# Patient Record
Sex: Male | Born: 1950
Health system: Southern US, Community
[De-identification: ages and names within clinical notes are randomized; demographics above are authoritative.]

## PROBLEM LIST (undated history)

## (undated) DIAGNOSIS — M1990 Unspecified osteoarthritis, unspecified site: Secondary | ICD-10-CM

## (undated) DIAGNOSIS — E119 Type 2 diabetes mellitus without complications: Secondary | ICD-10-CM

## (undated) DIAGNOSIS — C61 Malignant neoplasm of prostate: Secondary | ICD-10-CM

## (undated) DIAGNOSIS — I10 Essential (primary) hypertension: Secondary | ICD-10-CM

---

## 1999-12-16 ENCOUNTER — Ambulatory Visit (HOSPITAL_COMMUNITY): Admission: RE | Admit: 1999-12-16 | Discharge: 1999-12-16 | Payer: Self-pay | Admitting: Neurology

## 1999-12-16 ENCOUNTER — Encounter: Payer: Self-pay | Admitting: Internal Medicine

## 2000-05-16 ENCOUNTER — Ambulatory Visit (HOSPITAL_COMMUNITY): Admission: RE | Admit: 2000-05-16 | Discharge: 2000-05-16 | Payer: Self-pay | Admitting: Internal Medicine

## 2004-04-25 ENCOUNTER — Encounter: Admission: RE | Admit: 2004-04-25 | Discharge: 2004-04-25 | Payer: Self-pay | Admitting: Emergency Medicine

## 2005-05-12 ENCOUNTER — Encounter: Admission: RE | Admit: 2005-05-12 | Discharge: 2005-05-12 | Payer: Self-pay | Admitting: Family Medicine

## 2005-07-24 HISTORY — PX: JOINT REPLACEMENT: SHX530

## 2005-08-29 ENCOUNTER — Inpatient Hospital Stay (HOSPITAL_COMMUNITY): Admission: RE | Admit: 2005-08-29 | Discharge: 2005-09-02 | Payer: Self-pay | Admitting: Orthopedic Surgery

## 2005-08-29 ENCOUNTER — Ambulatory Visit: Payer: Self-pay | Admitting: Specialist

## 2006-09-07 ENCOUNTER — Encounter: Admission: RE | Admit: 2006-09-07 | Discharge: 2006-09-07 | Payer: Self-pay | Admitting: Nephrology

## 2013-05-29 ENCOUNTER — Other Ambulatory Visit: Payer: Self-pay | Admitting: Urology

## 2013-07-25 ENCOUNTER — Encounter (HOSPITAL_COMMUNITY): Payer: Self-pay | Admitting: Pharmacy Technician

## 2013-07-31 ENCOUNTER — Encounter (HOSPITAL_COMMUNITY): Payer: Self-pay

## 2013-07-31 ENCOUNTER — Encounter (HOSPITAL_COMMUNITY)
Admission: RE | Admit: 2013-07-31 | Discharge: 2013-07-31 | Disposition: A | Discharge status: Home / Self Care | Payer: Medicare Other | Source: Ambulatory Visit | Attending: Urology | Admitting: Urology

## 2013-07-31 DIAGNOSIS — C61 Malignant neoplasm of prostate: Secondary | ICD-10-CM | POA: Insufficient documentation

## 2013-07-31 DIAGNOSIS — Z01812 Encounter for preprocedural laboratory examination: Secondary | ICD-10-CM | POA: Insufficient documentation

## 2013-07-31 HISTORY — DX: Unspecified osteoarthritis, unspecified site: M19.90

## 2013-07-31 HISTORY — DX: Type 2 diabetes mellitus without complications: E11.9

## 2013-07-31 HISTORY — DX: Malignant neoplasm of prostate: C61

## 2013-07-31 HISTORY — DX: Essential (primary) hypertension: I10

## 2013-07-31 LAB — BASIC METABOLIC PANEL
BUN: 11 mg/dL (ref 6–23)
CO2: 28 meq/L (ref 19–32)
CREATININE: 1.16 mg/dL (ref 0.50–1.35)
Calcium: 9.5 mg/dL (ref 8.4–10.5)
Chloride: 96 mEq/L (ref 96–112)
GFR calc Af Amer: 75 mL/min — ABNORMAL LOW (ref >90)
GFR, EST NON AFRICAN AMERICAN: 65 mL/min — AB (ref >90)
Glucose, Bld: 209 mg/dL — ABNORMAL HIGH (ref 70–99)
Potassium: 3.4 mEq/L — ABNORMAL LOW (ref 3.7–5.3)
Sodium: 136 mEq/L — ABNORMAL LOW (ref 137–147)

## 2013-07-31 LAB — CBC
HCT: 44 % (ref 39.0–52.0)
HEMOGLOBIN: 16.3 g/dL (ref 13.0–17.0)
MCH: 32.5 pg (ref 26.0–34.0)
MCHC: 37 g/dL — ABNORMAL HIGH (ref 30.0–36.0)
MCV: 87.6 fL (ref 78.0–100.0)
Platelets: 202 10*3/uL (ref 150–400)
RBC: 5.02 MIL/uL (ref 4.22–5.81)
RDW: 13.1 % (ref 11.5–15.5)
WBC: 4.7 10*3/uL (ref 4.0–10.5)

## 2013-07-31 NOTE — Progress Notes (Signed)
07/31/13 0820  OBSTRUCTIVE SLEEP APNEA  Have you ever been diagnosed with sleep apnea through a sleep study? No  Do you snore loudly (loud enough to be heard through closed doors)?  1  Do you often feel tired, fatigued, or sleepy during the daytime? 0  Has anyone observed you stop breathing during your sleep? 0  Do you have, or are you being treated for high blood pressure? 1  BMI more than 35 kg/m2? 0  Age over 65 years old? 1  Neck circumference greater than 40 cm/18 inches? 0  Gender: 1  Obstructive Sleep Apnea Score 4  Score 4 or greater  Results sent to PCP

## 2013-07-31 NOTE — Patient Instructions (Signed)
Arthur Carr  07/31/2013                           YOUR PROCEDURE IS SCHEDULED ON: 08/06/13               PLEASE REPORT TO SHORT STAY CENTER AT : 10:15 AM               CALL THIS NUMBER IF ANY PROBLEMS THE DAY OF SURGERY :               [phone]                      REMEMBER:   Do not eat food or drink liquids AFTER MIDNIGHT  May have clear liquids UNTIL 6 HOURS BEFORE SURGERY (6:45 AM)  Clear liquids include soda, tea, black coffee, apple or grape juice, broth.  Take these medicines the morning of surgery with A SIP OF WATER:  AMLODIPINE   Do not wear jewelry, make-up   Do not wear lotions, powders, or perfumes.   Do not shave legs or underarms 12 hrs. before surgery (men may shave face)  Do not bring valuables to the hospital.  Contacts, dentures or bridgework may not be worn into surgery.  Leave suitcase in the car. After surgery it may be brought to your room.  For patients admitted to the hospital more than one night, checkout time is 11:00                          The day of discharge.   Patients discharged the day of surgery will not be allowed to drive home                             If going home same day of surgery, must have someone stay with you first                           24 hrs at home and arrange for some one to drive you home from hospital.    Special Instructions:   Please read over the following fact sheets that you were given:                       1. [place]                                                X_____________________________________________________________________        Failure to follow these instructions may result in cancellation of your surgery

## 2013-08-06 ENCOUNTER — Encounter (HOSPITAL_COMMUNITY): Payer: Self-pay | Admitting: *Deleted

## 2013-08-06 ENCOUNTER — Encounter (HOSPITAL_COMMUNITY): Payer: Medicare Other | Admitting: Anesthesiology

## 2013-08-06 ENCOUNTER — Encounter (HOSPITAL_COMMUNITY)
Admission: RE | Disposition: A | Discharge status: Home / Self Care | Payer: Self-pay | Source: Ambulatory Visit | Attending: Urology

## 2013-08-06 ENCOUNTER — Inpatient Hospital Stay (HOSPITAL_COMMUNITY)
Admission: RE | Admit: 2013-08-06 | Discharge: 2013-08-08 | DRG: 708 | Disposition: A | Discharge status: Home / Self Care | Payer: Medicare Other | Source: Ambulatory Visit | Attending: Urology | Admitting: Urology

## 2013-08-06 ENCOUNTER — Inpatient Hospital Stay (HOSPITAL_COMMUNITY): Payer: Medicare Other | Admitting: Anesthesiology

## 2013-08-06 DIAGNOSIS — F17200 Nicotine dependence, unspecified, uncomplicated: Secondary | ICD-10-CM | POA: Diagnosis present

## 2013-08-06 DIAGNOSIS — E669 Obesity, unspecified: Secondary | ICD-10-CM | POA: Diagnosis present

## 2013-08-06 DIAGNOSIS — Z01812 Encounter for preprocedural laboratory examination: Secondary | ICD-10-CM

## 2013-08-06 DIAGNOSIS — I10 Essential (primary) hypertension: Secondary | ICD-10-CM | POA: Diagnosis present

## 2013-08-06 DIAGNOSIS — Z6832 Body mass index (BMI) 32.0-32.9, adult: Secondary | ICD-10-CM

## 2013-08-06 DIAGNOSIS — E119 Type 2 diabetes mellitus without complications: Secondary | ICD-10-CM | POA: Diagnosis present

## 2013-08-06 DIAGNOSIS — E291 Testicular hypofunction: Secondary | ICD-10-CM | POA: Diagnosis present

## 2013-08-06 DIAGNOSIS — C61 Malignant neoplasm of prostate: Principal | ICD-10-CM | POA: Diagnosis present

## 2013-08-06 DIAGNOSIS — K429 Umbilical hernia without obstruction or gangrene: Secondary | ICD-10-CM | POA: Diagnosis present

## 2013-08-06 DIAGNOSIS — Z96649 Presence of unspecified artificial hip joint: Secondary | ICD-10-CM

## 2013-08-06 HISTORY — PX: ROBOT ASSISTED LAPAROSCOPIC RADICAL PROSTATECTOMY: SHX5141

## 2013-08-06 HISTORY — PX: LYMPHADENECTOMY: SHX5960

## 2013-08-06 LAB — GLUCOSE, CAPILLARY
GLUCOSE-CAPILLARY: 177 mg/dL — AB (ref 70–99)
GLUCOSE-CAPILLARY: 197 mg/dL — AB (ref 70–99)
Glucose-Capillary: 180 mg/dL — ABNORMAL HIGH (ref 70–99)

## 2013-08-06 LAB — TYPE AND SCREEN
ABO/RH(D): A POS
ANTIBODY SCREEN: NEGATIVE

## 2013-08-06 LAB — ABO/RH: ABO/RH(D): A POS

## 2013-08-06 SURGERY — ROBOTIC ASSISTED LAPAROSCOPIC RADICAL PROSTATECTOMY
Anesthesia: General | Site: Pelvis

## 2013-08-06 MED ORDER — SENNA 8.6 MG PO TABS
1.0000 | ORAL_TABLET | Freq: Two times a day (BID) | ORAL | Status: DC
  Administered 2013-08-06 – 2013-08-08 (×4): 8.6 mg via ORAL
  Filled 2013-08-06 (×4): qty 1

## 2013-08-06 MED ORDER — FUROSEMIDE 80 MG PO TABS
80.0000 mg | ORAL_TABLET | Freq: Two times a day (BID) | ORAL | Status: DC
  Administered 2013-08-07 – 2013-08-08 (×3): 80 mg via ORAL
  Filled 2013-08-06 (×5): qty 1

## 2013-08-06 MED ORDER — LABETALOL HCL 5 MG/ML IV SOLN
INTRAVENOUS | Status: AC
  Filled 2013-08-06: qty 4

## 2013-08-06 MED ORDER — FENTANYL CITRATE 0.05 MG/ML IJ SOLN
INTRAMUSCULAR | Status: AC
  Filled 2013-08-06: qty 5

## 2013-08-06 MED ORDER — ONDANSETRON HCL 4 MG/2ML IJ SOLN
INTRAMUSCULAR | Status: DC | PRN
  Administered 2013-08-06: 4 mg via INTRAVENOUS

## 2013-08-06 MED ORDER — SIMVASTATIN 10 MG PO TABS
10.0000 mg | ORAL_TABLET | Freq: Every day | ORAL | Status: DC
  Administered 2013-08-07: 10 mg via ORAL
  Filled 2013-08-06 (×2): qty 1

## 2013-08-06 MED ORDER — LACTATED RINGERS IV SOLN
INTRAVENOUS | Status: DC | PRN
  Administered 2013-08-06: 12:00:00 via INTRAVENOUS

## 2013-08-06 MED ORDER — SODIUM CHLORIDE 0.9 % IJ SOLN
INTRAMUSCULAR | Status: AC
  Filled 2013-08-06: qty 20

## 2013-08-06 MED ORDER — HYDROMORPHONE HCL PF 1 MG/ML IJ SOLN
INTRAMUSCULAR | Status: AC
  Filled 2013-08-06: qty 1

## 2013-08-06 MED ORDER — CEFAZOLIN SODIUM-DEXTROSE 2-3 GM-% IV SOLR
2.0000 g | INTRAVENOUS | Status: AC
  Administered 2013-08-06: 2 g via INTRAVENOUS

## 2013-08-06 MED ORDER — HEPARIN SODIUM (PORCINE) 5000 UNIT/ML IJ SOLN
5000.0000 [IU] | Freq: Three times a day (TID) | INTRAMUSCULAR | Status: DC
  Administered 2013-08-06 – 2013-08-08 (×5): 5000 [IU] via SUBCUTANEOUS
  Filled 2013-08-06 (×8): qty 1

## 2013-08-06 MED ORDER — OXYBUTYNIN CHLORIDE 5 MG PO TABS
5.0000 mg | ORAL_TABLET | Freq: Three times a day (TID) | ORAL | Status: DC | PRN
  Filled 2013-08-06: qty 1

## 2013-08-06 MED ORDER — AMLODIPINE BESYLATE 5 MG PO TABS
5.0000 mg | ORAL_TABLET | Freq: Every day | ORAL | Status: DC
  Administered 2013-08-07 – 2013-08-08 (×2): 5 mg via ORAL
  Filled 2013-08-06 (×2): qty 1

## 2013-08-06 MED ORDER — ACETAMINOPHEN 500 MG PO TABS
1000.0000 mg | ORAL_TABLET | Freq: Four times a day (QID) | ORAL | Status: AC
  Administered 2013-08-06 – 2013-08-07 (×4): 1000 mg via ORAL
  Filled 2013-08-06 (×4): qty 2

## 2013-08-06 MED ORDER — HYDROCODONE-ACETAMINOPHEN 5-325 MG PO TABS: 1.0000 | ORAL_TABLET | Freq: Four times a day (QID) | ORAL | Status: DC | PRN

## 2013-08-06 MED ORDER — CIPROFLOXACIN HCL 500 MG PO TABS: 500.0000 mg | ORAL_TABLET | Freq: Two times a day (BID) | ORAL | Status: DC

## 2013-08-06 MED ORDER — SODIUM CHLORIDE 0.9 % IV SOLN
INTRAVENOUS | Status: DC
  Administered 2013-08-07 (×2): via INTRAVENOUS

## 2013-08-06 MED ORDER — CEFAZOLIN SODIUM-DEXTROSE 2-3 GM-% IV SOLR
INTRAVENOUS | Status: AC
  Filled 2013-08-06: qty 50

## 2013-08-06 MED ORDER — BUPIVACAINE LIPOSOME 1.3 % IJ SUSP
20.0000 mL | Freq: Once | INTRAMUSCULAR | Status: DC
  Filled 2013-08-06: qty 20

## 2013-08-06 MED ORDER — ROCURONIUM BROMIDE 100 MG/10ML IV SOLN
INTRAVENOUS | Status: AC
  Filled 2013-08-06: qty 1

## 2013-08-06 MED ORDER — LABETALOL HCL 5 MG/ML IV SOLN
INTRAVENOUS | Status: DC | PRN
  Administered 2013-08-06 (×2): 5 mg via INTRAVENOUS

## 2013-08-06 MED ORDER — SODIUM CHLORIDE 0.9 % IR SOLN
Status: DC | PRN
  Administered 2013-08-06: 1000 mL via INTRAVESICAL

## 2013-08-06 MED ORDER — LIDOCAINE HCL (CARDIAC) 20 MG/ML IV SOLN
INTRAVENOUS | Status: DC | PRN
  Administered 2013-08-06: 100 mg via INTRAVENOUS

## 2013-08-06 MED ORDER — MIDAZOLAM HCL 5 MG/5ML IJ SOLN
INTRAMUSCULAR | Status: DC | PRN
  Administered 2013-08-06: 2 mg via INTRAVENOUS

## 2013-08-06 MED ORDER — OXYCODONE HCL 5 MG PO TABS
5.0000 mg | ORAL_TABLET | ORAL | Status: DC | PRN
  Administered 2013-08-07: 5 mg via ORAL
  Filled 2013-08-06: qty 1

## 2013-08-06 MED ORDER — BENAZEPRIL HCL 40 MG PO TABS
40.0000 mg | ORAL_TABLET | Freq: Every day | ORAL | Status: DC
  Administered 2013-08-07 – 2013-08-08 (×2): 40 mg via ORAL
  Filled 2013-08-06 (×3): qty 1

## 2013-08-06 MED ORDER — PROPOFOL 10 MG/ML IV BOLUS
INTRAVENOUS | Status: AC
  Filled 2013-08-06: qty 20

## 2013-08-06 MED ORDER — SUCCINYLCHOLINE CHLORIDE 20 MG/ML IJ SOLN
INTRAMUSCULAR | Status: DC | PRN
  Administered 2013-08-06: 100 mg via INTRAVENOUS

## 2013-08-06 MED ORDER — STERILE WATER FOR IRRIGATION IR SOLN
Status: DC | PRN
  Administered 2013-08-06: 3000 mL

## 2013-08-06 MED ORDER — LACTATED RINGERS IR SOLN
Status: DC | PRN
  Administered 2013-08-06: 1000 mL

## 2013-08-06 MED ORDER — ONDANSETRON HCL 4 MG/2ML IJ SOLN
INTRAMUSCULAR | Status: AC
  Filled 2013-08-06: qty 2

## 2013-08-06 MED ORDER — GLYCOPYRROLATE 0.2 MG/ML IJ SOLN
INTRAMUSCULAR | Status: DC | PRN
  Administered 2013-08-06: .8 mg via INTRAVENOUS

## 2013-08-06 MED ORDER — PROMETHAZINE HCL 25 MG/ML IJ SOLN: 6.2500 mg | INTRAMUSCULAR | Status: DC | PRN

## 2013-08-06 MED ORDER — SODIUM CHLORIDE 0.9 % IV BOLUS (SEPSIS)
1000.0000 mL | Freq: Once | INTRAVENOUS | Status: AC
  Administered 2013-08-06: 1000 mL via INTRAVENOUS

## 2013-08-06 MED ORDER — HYDROMORPHONE HCL PF 1 MG/ML IJ SOLN
0.2500 mg | INTRAMUSCULAR | Status: DC | PRN
  Administered 2013-08-06 (×4): 0.5 mg via INTRAVENOUS

## 2013-08-06 MED ORDER — SODIUM CHLORIDE 0.9 % IJ SOLN
INTRAMUSCULAR | Status: DC | PRN
  Administered 2013-08-06: 17:00:00

## 2013-08-06 MED ORDER — INSULIN ASPART 100 UNIT/ML ~~LOC~~ SOLN
0.0000 [IU] | Freq: Three times a day (TID) | SUBCUTANEOUS | Status: DC
  Administered 2013-08-07 – 2013-08-08 (×4): 3 [IU] via SUBCUTANEOUS

## 2013-08-06 MED ORDER — FENTANYL CITRATE 0.05 MG/ML IJ SOLN
INTRAMUSCULAR | Status: DC | PRN
  Administered 2013-08-06: 50 ug via INTRAVENOUS
  Administered 2013-08-06 (×2): 100 ug via INTRAVENOUS
  Administered 2013-08-06: 50 ug via INTRAVENOUS
  Administered 2013-08-06: 100 ug via INTRAVENOUS
  Administered 2013-08-06 (×2): 50 ug via INTRAVENOUS

## 2013-08-06 MED ORDER — NEOSTIGMINE METHYLSULFATE 1 MG/ML IJ SOLN
INTRAMUSCULAR | Status: DC | PRN
  Administered 2013-08-06: 5 mg via INTRAVENOUS

## 2013-08-06 MED ORDER — ROCURONIUM BROMIDE 100 MG/10ML IV SOLN
INTRAVENOUS | Status: DC | PRN
  Administered 2013-08-06: 50 mg via INTRAVENOUS
  Administered 2013-08-06: 20 mg via INTRAVENOUS

## 2013-08-06 MED ORDER — GLYCOPYRROLATE 0.2 MG/ML IJ SOLN
INTRAMUSCULAR | Status: AC
  Filled 2013-08-06: qty 4

## 2013-08-06 MED ORDER — INSULIN ASPART 100 UNIT/ML ~~LOC~~ SOLN
4.0000 [IU] | Freq: Three times a day (TID) | SUBCUTANEOUS | Status: DC
Start: 2013-08-07 — End: 2013-08-08
  Administered 2013-08-07 (×3): 4 [IU] via SUBCUTANEOUS

## 2013-08-06 MED ORDER — INDOCYANINE GREEN 25 MG IV SOLR
INTRAVENOUS | Status: DC | PRN
  Administered 2013-08-06: 1 mg

## 2013-08-06 MED ORDER — PROPOFOL 10 MG/ML IV BOLUS
INTRAVENOUS | Status: DC | PRN
  Administered 2013-08-06: 200 mg via INTRAVENOUS

## 2013-08-06 MED ORDER — LIDOCAINE HCL (CARDIAC) 20 MG/ML IV SOLN
INTRAVENOUS | Status: AC
  Filled 2013-08-06: qty 5

## 2013-08-06 MED ORDER — DOCUSATE SODIUM 100 MG PO CAPS
100.0000 mg | ORAL_CAPSULE | Freq: Two times a day (BID) | ORAL | Status: DC
  Administered 2013-08-06 – 2013-08-08 (×4): 100 mg via ORAL
  Filled 2013-08-06 (×5): qty 1

## 2013-08-06 MED ORDER — HYDROMORPHONE HCL PF 1 MG/ML IJ SOLN: 0.5000 mg | INTRAMUSCULAR | Status: DC | PRN

## 2013-08-06 MED ORDER — MIDAZOLAM HCL 2 MG/2ML IJ SOLN
INTRAMUSCULAR | Status: AC
  Filled 2013-08-06: qty 2

## 2013-08-06 SURGICAL SUPPLY — 62 items
ADH SKN CLS APL DERMABOND .7 (GAUZE/BANDAGES/DRESSINGS) ×2
CABLE HIGH FREQUENCY MONO STRZ (ELECTRODE) ×3 IMPLANT
CANISTER SUCTION 2500CC (MISCELLANEOUS) ×3 IMPLANT
CATH FOLEY 2WAY SLVR 18FR 30CC (CATHETERS) ×3 IMPLANT
CATH TIEMANN FOLEY 18FR 5CC (CATHETERS) ×3 IMPLANT
CHLORAPREP W/TINT 26ML (MISCELLANEOUS) ×3 IMPLANT
CLIP LIGATING HEM O LOK PURPLE (MISCELLANEOUS) ×8 IMPLANT
CLIP LIGATING HEMO LOK XL GOLD (MISCELLANEOUS) ×4 IMPLANT
CLOTH BEACON ORANGE TIMEOUT ST (SAFETY) ×3 IMPLANT
CONT SPECI 4OZ STER CLIK (MISCELLANEOUS) ×3 IMPLANT
COVER SURGICAL LIGHT HANDLE (MISCELLANEOUS) ×3 IMPLANT
COVER TIP SHEARS 8 DVNC (MISCELLANEOUS) ×2 IMPLANT
COVER TIP SHEARS 8MM DA VINCI (MISCELLANEOUS) ×1
CUTTER ECHEON FLEX ENDO 45 340 (ENDOMECHANICALS) ×3 IMPLANT
DECANTER SPIKE VIAL GLASS SM (MISCELLANEOUS) ×3 IMPLANT
DERMABOND ADVANCED (GAUZE/BANDAGES/DRESSINGS) ×1
DERMABOND ADVANCED .7 DNX12 (GAUZE/BANDAGES/DRESSINGS) ×2 IMPLANT
DRAPE SURG IRRIG POUCH 19X23 (DRAPES) ×3 IMPLANT
DRSG TEGADERM 2-3/8X2-3/4 SM (GAUZE/BANDAGES/DRESSINGS) ×12 IMPLANT
DRSG TEGADERM 4X4.75 (GAUZE/BANDAGES/DRESSINGS) ×6 IMPLANT
DRSG TEGADERM 6X8 (GAUZE/BANDAGES/DRESSINGS) ×6 IMPLANT
ELECT REM PT RETURN 9FT ADLT (ELECTROSURGICAL) ×3
ELECTRODE REM PT RTRN 9FT ADLT (ELECTROSURGICAL) ×2 IMPLANT
GAUZE SPONGE 2X2 8PLY STRL LF (GAUZE/BANDAGES/DRESSINGS) ×2 IMPLANT
GLOVE BIO SURGEON STRL SZ 6.5 (GLOVE) ×3 IMPLANT
GLOVE BIOGEL M STRL SZ7.5 (GLOVE) ×9 IMPLANT
GLOVE BIOGEL PI IND STRL 6.5 (GLOVE) IMPLANT
GLOVE BIOGEL PI IND STRL 7.0 (GLOVE) IMPLANT
GLOVE BIOGEL PI INDICATOR 6.5 (GLOVE) ×2
GLOVE BIOGEL PI INDICATOR 7.0 (GLOVE) ×2
GLOVE SS BIOGEL STRL SZ 7 (GLOVE) IMPLANT
GLOVE SUPERSENSE BIOGEL SZ 7 (GLOVE) ×3
GOWN STRL REUS W/TWL LRG LVL3 (GOWN DISPOSABLE) ×8 IMPLANT
GOWN STRL REUS W/TWL XL LVL3 (GOWN DISPOSABLE) ×6 IMPLANT
HOLDER FOLEY CATH W/STRAP (MISCELLANEOUS) ×3 IMPLANT
IV LACTATED RINGERS 1000ML (IV SOLUTION) ×3 IMPLANT
KIT ACCESSORY DA VINCI DISP (KITS) ×1
KIT ACCESSORY DVNC DISP (KITS) ×2 IMPLANT
KIT PROCEDURE DA VINCI SI (MISCELLANEOUS) ×1
KIT PROCEDURE DVNC SI (MISCELLANEOUS) ×2 IMPLANT
NDL INSUFFLATION 14GA 120MM (NEEDLE) ×2 IMPLANT
NEEDLE INSUFFLATION 14GA 120MM (NEEDLE) ×3 IMPLANT
NEEDLE SPNL 22GX7 SPINOC (NEEDLE) ×3 IMPLANT
PACK ROBOT UROLOGY CUSTOM (CUSTOM PROCEDURE TRAY) ×3 IMPLANT
RELOAD GREEN ECHELON 45 (STAPLE) ×3 IMPLANT
SET TUBE IRRIG SUCTION NO TIP (IRRIGATION / IRRIGATOR) ×3 IMPLANT
SOLUTION ELECTROLUBE (MISCELLANEOUS) ×3 IMPLANT
SPONGE GAUZE 2X2 STER 10/PKG (GAUZE/BANDAGES/DRESSINGS) ×1
SPONGE LAP 4X18 X RAY DECT (DISPOSABLE) ×3 IMPLANT
SUT ETHILON 3 0 PS 1 (SUTURE) ×3 IMPLANT
SUT MNCRL AB 4-0 PS2 18 (SUTURE) ×6 IMPLANT
SUT PDS AB 1 CT1 27 (SUTURE) ×6 IMPLANT
SUT PROLENE 1 CT 1 30 (SUTURE) ×2 IMPLANT
SUT VIC AB 2-0 SH 27 (SUTURE) ×6
SUT VIC AB 2-0 SH 27X BRD (SUTURE) IMPLANT
SUT VICRYL 0 UR6 27IN ABS (SUTURE) ×3 IMPLANT
SUT VLOC BARB 180 ABS3/0GR12 (SUTURE) ×9
SUTURE VLOC BRB 180 ABS3/0GR12 (SUTURE) IMPLANT
SYR 27GX1/2 1ML LL SAFETY (SYRINGE) ×3 IMPLANT
TOWEL OR NON WOVEN STRL DISP B (DISPOSABLE) ×3 IMPLANT
TROCAR 12M 150ML BLUNT (TROCAR) ×3 IMPLANT
WATER STERILE IRR 1500ML POUR (IV SOLUTION) ×6 IMPLANT

## 2013-08-06 NOTE — Anesthesia Postprocedure Evaluation (Signed)
  Anesthesia Post-op Note  Patient: Arthur Carr  Procedure(s) Performed: Procedure(s) (LRB): ROBOTIC ASSISTED LAPAROSCOPIC RADICAL PROSTATECTOMY/OPEN UMBILICAL HERNIA REPAIR (N/A) LYMPHADENECTOMY AND INDOCYANINE GREEN DYE  INJECTION (Bilateral)  Patient Location: PACU  Anesthesia Type: General  Level of Consciousness: awake and alert   Airway and Oxygen Therapy: Patient Spontanous Breathing  Post-op Pain: mild  Post-op Assessment: Post-op Vital signs reviewed, Patient's Cardiovascular Status Stable, Respiratory Function Stable, Patent Airway and No signs of Nausea or vomiting  Last Vitals:  Filed Vitals:   08/06/13 1907  BP: 169/75  Pulse: 85  Temp: 36.4 C  Resp:     Post-op Vital Signs: stable   Complications: No apparent anesthesia complications

## 2013-08-06 NOTE — Transfer of Care (Signed)
Immediate Anesthesia Transfer of Care Note  Patient: Devlon Dosher  Procedure(s) Performed: Procedure(s): ROBOTIC ASSISTED LAPAROSCOPIC RADICAL PROSTATECTOMY/OPEN UMBILICAL HERNIA REPAIR (N/A) LYMPHADENECTOMY AND INDOCYANINE GREEN DYE  INJECTION (Bilateral)  Patient Location: PACU  Anesthesia Type:General  Level of Consciousness: awake, alert , oriented and patient cooperative  Airway & Oxygen Therapy: Patient Spontanous Breathing and Patient connected to face mask oxygen  Post-op Assessment: Report given to PACU RN, Post -op Vital signs reviewed and stable and Patient moving all extremities  Post vital signs: Reviewed and stable  Complications: No apparent anesthesia complications

## 2013-08-06 NOTE — H&P (Signed)
Arthur Carr is an 63 y.o. male.    Chief Complaint: Pre-Op Robotic Prostatectomy With Bilateral Pelvic Lymphadenectomy  HPI:   1 - Moderate Risk Prostate Cancer - Gleason 3+4=7 RLM, Gl6 RLM,RMB,RMM by biopsy 04/2013 on eval of PSA 4.51. TRUS volume 42mL, no large median lobe. No FHX prostate cancer  Recent PSA History: 2014 - 4.51  2013 - 3.7 2012 -2.92  2 - Hypogonadism - Pt with low free/total T 2013, tried Androgel and did not have any symtpom change therefore stopped and has been off for > 1 yearl. Denies significant low libido.   PMH sig for DM2, 20PY smoker (still smokes), HTN, Hip Replacement. No CV diseas. No blood thinners.  Today Thanos is seen to proceed with robotic prostatectomy. No interval fevers.   Past Medical History  Diagnosis Date  . Hypertension   . Arthritis   . Prostate cancer   . Diabetes mellitus without complication     Past Surgical History  Procedure Laterality Date  . Joint replacement  2007    RT TOTAL HIP    No family history on file. Social History:  reports that he has been smoking.  He does not have any smokeless tobacco history on file. He reports that he does not drink alcohol or use illicit drugs.  Allergies: No Known Allergies  No prescriptions prior to admission    No results found for this or any previous visit (from the past 48 hour(s)). No results found.  Review of Systems  Constitutional: Negative.  Negative for fever and chills.  HENT: Negative.   Eyes: Negative.   Respiratory: Negative.   Cardiovascular: Negative.   Gastrointestinal: Negative.   Genitourinary: Negative.   Musculoskeletal: Negative.   Skin: Negative.   Neurological: Negative.   Endo/Heme/Allergies: Negative.   Psychiatric/Behavioral: Negative.     There were no vitals taken for this visit. Physical Exam  Constitutional: He is oriented to person, place, and time. He appears well-developed and well-nourished.  HENT:  Head: Normocephalic  and atraumatic.  Eyes: EOM are normal. Pupils are equal, round, and reactive to light.  Neck: Normal range of motion. Neck supple.  Cardiovascular: Normal rate and regular rhythm.   Respiratory: Effort normal and breath sounds normal.  GI: Soft. Bowel sounds are normal.  Genitourinary: Rectum normal and penis normal.  Musculoskeletal: Normal range of motion.  Neurological: He is alert and oriented to person, place, and time.  Skin: Skin is warm and dry.  Psychiatric: He has a normal mood and affect. His behavior is normal. Judgment and thought content normal.     Assessment/Plan   1 - Moderate Risk Prostate Cancer - he has very good understanding of treatemnt options and wants to proceed with surgery today as scheduled.   We rediscussed prostatectomy and specifically robotic prostatectomy with bilateral pelvic lymphadenectomy being the technique that I most commonly perform. I showed the patient on their abdomen the approximately 6 small incision (trocar) sites as well as presumed extraction sites with robotic approach as well as possible open incision sites should open conversion be necessary. We rediscussed peri-operative risks including bleeding, infection, deep vein thrombosis, pulmonary embolism, compartment syndrome, nuropathy / neuropraxia, heart attack, stroke, death, as well as long-term risks such as non-cure / need for additional therapy. We specifically readdressed that the procedure would compromise urinary control leading to stress incontinence which typically resolves with time and pelvic rehabilitation (Kegel's, etc..), but can sometimes be permanent and require additional therapy including surgery. We also specifically  addressed sexual sequellae including significant erectile dysfunction which typically partially resolves with time but can also be permanent and require additional therapy including surgery.   We rediscussed the typical hospital course including usual 1-2 night  hospitalization, discharge with foley catheter in place usually for 1-2 weeks before voiding trial as well as usually 2 week recovery until able to perform most non-strenuous activity and 6 weeks until able to return to most jobs and more strenuous activity such as exercise.   2 - Hypogonadism - Minimally symptomatic. Agree with non-treatment especially in setting of biopsy-proven prostate cancer.    Aleysia Oltmann 08/06/2013, 7:27 AM

## 2013-08-06 NOTE — Anesthesia Procedure Notes (Addendum)
Procedure Name: Intubation Date/Time: 08/06/2013 1:18 PM Performed by: Darlys Gales R Patient Re-evaluated:Patient Re-evaluated prior to inductionOxygen Delivery Method: Circle system utilized Preoxygenation: Pre-oxygenation with 100% oxygen Intubation Type: IV induction Ventilation: Mask ventilation without difficulty Laryngoscope Size: Mac and 4 Grade View: Grade I Tube type: Oral Tube size: 7.5 mm Number of attempts: 1 Airway Equipment and Method: Stylet Placement Confirmation: ETT inserted through vocal cords under direct vision,  positive ETCO2 and breath sounds checked- equal and bilateral Secured at: 23 cm Tube secured with: Tape Dental Injury: Teeth and Oropharynx as per pre-operative assessment

## 2013-08-06 NOTE — Preoperative (Signed)
Beta Blockers   Reason not to administer Beta Blockers:Not Applicable 

## 2013-08-06 NOTE — Discharge Instructions (Signed)

## 2013-08-06 NOTE — Brief Op Note (Signed)
08/06/2013  5:20 PM  PATIENT:  Arthur Carr  63 y.o. male  PRE-OPERATIVE DIAGNOSIS:  PROSTATE CANCER  POST-OPERATIVE DIAGNOSIS:  PROSTATE CANCER/UMBILICAL HERNIA  PROCEDURE:  Procedure(s): ROBOTIC ASSISTED LAPAROSCOPIC RADICAL PROSTATECTOMY/OPEN UMBILICAL HERNIA REPAIR (N/A) LYMPHADENECTOMY AND INDOCYANINE GREEN DYE  INJECTION (Bilateral)  SURGEON:  Surgeon(s) and Role:    * Alexis Frock, MD - Primary  PHYSICIAN ASSISTANT:   ASSISTANTS: Felipa Furnace, PA   ANESTHESIA:   general  EBL:  Total I/O In: 1000 [I.V.:1000] Out: 100 [Blood:100]  BLOOD ADMINISTERED:none  DRAINS: 1 - JP to bulb suction, 2 - Foley to straight drain   LOCAL MEDICATIONS USED:  MARCAINE     SPECIMEN:  Source of Specimen:  1 - Bilatera Pelvi Lymph Nodes, 2 - Radical Prostattecotmy, 3 - Left ext. iliac lymph node sentintal, 4- Periprostatic Fat  DISPOSITION OF SPECIMEN:  PATHOLOGY  COUNTS:  YES  TOURNIQUET:  * No tourniquets in log *  DICTATION: .Other Dictation: Dictation Number  [id number]  PLAN OF CARE: Admit to inpatient   PATIENT DISPOSITION:  PACU - hemodynamically stable.   Delay start of Pharmacological VTE agent (>24hrs) due to surgical blood loss or risk of bleeding: yes

## 2013-08-06 NOTE — Anesthesia Preprocedure Evaluation (Addendum)
Anesthesia Evaluation  Patient identified by MRN, date of birth, ID band Patient awake    Reviewed: Allergy & Precautions, H&P , NPO status , Patient's Chart, lab work & pertinent test results  Airway Mallampati: II TM Distance: >3 FB Neck ROM: Full    Dental  (+) Edentulous Upper, Poor Dentition and Dental Advisory Given   Pulmonary neg pulmonary ROS, Current Smoker,  breath sounds clear to auscultation  Pulmonary exam normal       Cardiovascular hypertension, Pt. on medications Rhythm:Regular Rate:Normal     Neuro/Psych negative neurological ROS  negative psych ROS   GI/Hepatic negative GI ROS, Neg liver ROS,   Endo/Other  diabetes, Poorly Controlled, Type 2, Oral Hypoglycemic Agents  Renal/GU negative Renal ROS  negative genitourinary   Musculoskeletal negative musculoskeletal ROS (+)   Abdominal (+) + obese,   Peds negative pediatric ROS (+)  Hematology negative hematology ROS (+)   Anesthesia Other Findings   Reproductive/Obstetrics negative OB ROS                          Anesthesia Physical Anesthesia Plan  ASA: III  Anesthesia Plan: General   Post-op Pain Management:    Induction: Intravenous  Airway Management Planned: Oral ETT  Additional Equipment:   Intra-op Plan:   Post-operative Plan: Extubation in OR  Informed Consent: I have reviewed the patients History and Physical, chart, labs and discussed the procedure including the risks, benefits and alternatives for the proposed anesthesia with the patient or authorized representative who has indicated his/her understanding and acceptance.   Dental advisory given  Plan Discussed with: CRNA  Anesthesia Plan Comments:         Anesthesia Quick Evaluation

## 2013-08-07 ENCOUNTER — Encounter (HOSPITAL_COMMUNITY): Payer: Self-pay | Admitting: Urology

## 2013-08-07 LAB — GLUCOSE, CAPILLARY
Glucose-Capillary: 166 mg/dL — ABNORMAL HIGH (ref 70–99)
Glucose-Capillary: 163 mg/dL — ABNORMAL HIGH (ref 70–99)
Glucose-Capillary: 172 mg/dL — ABNORMAL HIGH (ref 70–99)
Glucose-Capillary: 163 mg/dL — ABNORMAL HIGH (ref 70–99)

## 2013-08-07 LAB — BASIC METABOLIC PANEL WITH GFR
BUN: 9 mg/dL (ref 6–23)
CO2: 22 meq/L (ref 19–32)
Calcium: 8.3 mg/dL — ABNORMAL LOW (ref 8.4–10.5)
Chloride: 98 meq/L (ref 96–112)
Creatinine, Ser: 1.08 mg/dL (ref 0.50–1.35)
GFR calc Af Amer: 82 mL/min — ABNORMAL LOW
GFR calc non Af Amer: 71 mL/min — ABNORMAL LOW
Glucose, Bld: 169 mg/dL — ABNORMAL HIGH (ref 70–99)
Potassium: 3.7 meq/L (ref 3.7–5.3)
Sodium: 135 meq/L — ABNORMAL LOW (ref 137–147)

## 2013-08-07 LAB — HEMOGLOBIN AND HEMATOCRIT, BLOOD
HCT: 40 % (ref 39.0–52.0)
Hemoglobin: 14.3 g/dL (ref 13.0–17.0)

## 2013-08-07 LAB — CREATININE, FLUID (PLEURAL, PERITONEAL, JP DRAINAGE): Creat, Fluid: 1.1 mg/dL

## 2013-08-07 NOTE — Progress Notes (Signed)
1 Day Post-Op  Subjective:  1 - Moderate Risk Prostate Cancer - s/p robotic prostatectomy + bilateral pelvic lymph node dissection (template + ICG sentinal) + umbilical hernia repair. JP Cr POD 1.1 (same as serum).   Today Arthur Carr is progressing well. Ambulatory, pain controlled, tolerating diet. No foley / drain problems.   Objective: Vital signs in last 24 hours: Temp:  [97.5 F (36.4 C)-99.4 F (37.4 C)] 98.4 F (36.9 C) (01/15 1403) Pulse Rate:  [81-98] 90 (01/15 1403) Resp:  [17-21] 18 (01/15 1403) BP: (142-169)/(70-90) 142/70 mmHg (01/15 1403) SpO2:  [98 %-100 %] 98 % (01/15 1403) Weight:  [104.4 kg (230 lb 2.6 oz)] 104.4 kg (230 lb 2.6 oz) (01/14 1907) Last BM Date: 08/05/13  Intake/Output from previous day: 01/14 0701 - 01/15 0700 In: 2800 [I.V.:2800] Out: 4780 [Urine:4300; Drains:330; Blood:150] Intake/Output this shift: Total I/O In: 240 [P.O.:240] Out: 810 [Urine:725; Drains:85]  General appearance: alert, cooperative and appears stated age Head: Normocephalic, without obvious abnormality, atraumatic Eyes: conjunctivae/corneas clear. PERRL, EOM's intact. Fundi benign. Ears: normal TM's and external ear canals both ears Nose: Nares normal. Septum midline. Mucosa normal. No drainage or sinus tenderness. Throat: lips, mucosa, and tongue normal; teeth and gums normal Neck: no adenopathy, no carotid bruit, no JVD, supple, symmetrical, trachea midline and thyroid not enlarged, symmetric, no tenderness/mass/nodules Back: symmetric, no curvature. ROM normal. No CVA tenderness. Resp: clear to auscultation bilaterally Chest wall: no tenderness Cardio: regular rate and rhythm, S1, S2 normal, no murmur, click, rub or gallop GI: soft, non-tender; bowel sounds normal; no masses,  no organomegaly Male genitalia: normal, foley c/d/i with light pink urine, foreskin down Extremities: Lt > Rt LE edema as per baseline.  Pulses: 2+ and symmetric Skin: Skin color, texture, turgor  normal. No rashes or lesions Lymph nodes: Cervical, supraclavicular, and axillary nodes normal. Neurologic: Grossly normal Incision/Wound: all port sites and extraction / hernia repair site c/d/i. JP with serosanguinous output.   Lab Results:   Recent Labs  08/07/13 0458  HGB 14.3  HCT 40.0   BMET  Recent Labs  08/07/13 0458  NA 135*  K 3.7  CL 98  CO2 22  GLUCOSE 169*  BUN 9  CREATININE 1.08  CALCIUM 8.3*   PT/INR No results found for this basename: LABPROT, INR,  in the last 72 hours ABG No results found for this basename: PHART, PCO2, PO2, HCO3,  in the last 72 hours  Studies/Results: No results found.  Anti-infectives: Anti-infectives   Start     Dose/Rate Route Frequency Ordered Stop   08/06/13 1006  ceFAZolin (ANCEF) IVPB 2 g/50 mL premix     2 g 100 mL/hr over 30 Minutes Intravenous 30 min pre-op 08/06/13 1006 08/06/13 1325   08/06/13 0000  ciprofloxacin (CIPRO) 500 MG tablet     500 mg Oral 2 times daily 08/06/13 1723        Assessment/Plan:  1 - Moderate Risk Prostate Cancer - Doing well POD 1. Discussed goals for discharge and will plan for DC in AM tomorrow. Continue IS, ambulation. Saline lock IV.  [facility], Diontae Route 08/07/2013

## 2013-08-07 NOTE — Progress Notes (Signed)
Utilization review completed.  

## 2013-08-07 NOTE — Op Note (Signed)
Arthur Carr, RUTIGLIANO NO.:  [id number]  MEDICAL RECORD NO.:  [id number]  LOCATION:  [id number]                         FACILITY:  [facility]  PHYSICIAN:  Alexis Frock, MD     DATE OF BIRTH:  Sep 10, 1950  DATE OF PROCEDURE: 08/06/2013  DATE OF DISCHARGE:                              OPERATIVE REPORT   DIAGNOSIS:  Moderate risk prostate cancer.  PROCEDURES: 1. Robotic-assisted laparoscopic radical prostatectomy. 2. Bilateral pelvic lymphadenectomy with ICG dye injection, sentinel. 3. Umbilical hernia repair.  ESTIMATED BLOOD LOSS:  150 mL.  COMPLICATIONS:  None.  SPECIMENS: 1. Radical prostatectomy. 2. Right external iliac lymph nodes. 3. Right obturator and internal iliac lymph nodes. 4. Periprostatic fat. 5. Left external iliac lymph nodes, sentinel. 6. Left obturator lymph nodes.  FINDINGS: 1. Single left external iliac lymph node hyperfluorescent with ICG dye     administration, this is very close to the iliac bifurcation on the     left, but part of the external iliac group. 2. Quarter sized umbilical hernia with fascial defect.  INDICATIONS:  Arthur Carr is a very pleasant 63 year old gentleman who was found on workup of persistently rising PSA, to have moderate risk of prostate cancer.  Options were discussed for management including active surveillance versus various forms of radiotherapy versus surgery with and without minimally invasive assistance and adamantly wished to proceed with robotic radical prostatectomy.  Given his moderate risk status, it was felt that bilateral pelvic lymphadenectomy was warranted. Informed consent was obtained and placed in the medical record. Notably, the patient also with small umbilical hernia, approximately a quarter sized fascial defect.  We planned to repair this concomitantly.  PROCEDURE IN DETAIL:  The patient being Arthur Carr, was verified. Procedure being radical prostatectomy and umbilical hernia repair  was confirmed.  Procedure was carried out.  Time-out was performed. Intravenous antibiotics were administered.  General endotracheal anesthesia was introduced.  The patient was placed into a low lithotomy position.  Sterile field was created by prepping and draping the patient's penis, perineum, and proximal thighs using iodine x3.  His infra-xiphoid abdomen was prepped using chlorhexidine gluconate.  His arms were tucked.  Sequential compression devices were applied and he has further fashioned on the operative table using 3-inch tape over foam at the level of his chest.  A test of steep Trendelenburg position was performed and he was found to be adequately positioned.  Foley catheter was placed per urethra to straight drain.  Next, high-flow, low-pressure pneumoperitoneum was obtained using Veress technique directly into the umbilical hernia.  After palpating that, there were no contents within this.  A 12-mm robotic camera port was then placed in the same location directly through the umbilical hernia defect.  The laparoscopic examination of the peritoneal cavity revealed no significant adhesions and no visceral injury.  Additional ports were then placed as follows; right paramedian, 8-mm robotic port; right far lateral, 12-mm assist port; right paramedian, 5 mm suction port; left paramedian, 8-mm robotic port; left far lateral, 8-mm robotic port.  Robot was docked and passed through electronic checks.  Initial attention was directed to development of space of Retzius.  Incision was made lateral to the left medial  umbilical ligament from the midline towards the area of the internal ring and coursing along the iliac vessels.  Holding short of the area of the ureter, mirror-image dissection was performed on the right side.  The bladder wall was carefully swept away from the pelvic sidewall from the base towards the area of the endopelvic fascia. Anterior attachments were taken down using  cautery dissection, this exposed the anterior base of the prostate.  At this point, the spinal needle was carefully guided using robotic assistance into the left lobe of the prostate.  A 0.2 mL of ICG dye was slowly given.  Hemostasis achieved with bipolar current.  A 0.2 mL was also given in the right prostatic lobe, taking great care to avoid spillage, this did not occur grossly.  There was immediately visible dye seen within lymphatics coursing across the surface of the prostate.  With infrared fluorescence, attention was then directed at the lateral prostatic dissection first on the left side.  The endopelvic fascia was carefully swept away from the lateral aspect of the prostate from the base to the apex.  Mirror image dissection was performed on the right side.  This exposed the area of the dorsal venous complex, which was controlled using endovascular stapler taking great care to avoid stapling of the urethra and this did not occur.  It had been approximately 15 minutes post-ICG dye injection and attention was then directed to lymphadenectomy.  The entire pelvis was carefully irrigated using infrared fluorescence.  The only hyperfluorescence sentinel node was a single relatively small node within the external iliac group on the left, very close to the iliac bifurcation.  No additional hyperfluorescence nodes were seen.  Attention was then directed to the right side.  All fiber fatty tissue in the confines of the external iliac artery and vein.  Iliac bifurcation and pelvic side wall were carefully mobilized.  Hemostasis was achieved with cold clips.  There was relatively large and firm feeling nodes in this packet distally corresponding to likely node of [place].  This set aside and labeled right external iliac lymph nodes.  Then, fiber fatty tissue in the confines of the right internal iliac artery, obturator nerve, pelvic side wall were carefully dissected.  Hemostasis achieved  with cold clips.  This set aside and labeled right obturator lymph nodes and internal iliac lymph nodes.  The obturator nerve inspected throughout its course following this maneuver and was found to be uninjured grossly.  Left-sided lymphadenectomy was then performed.  The left external iliac group was dissected free, again with the confines being the bifurcation of the iliac vessels, left external iliac artery, vein, pelvic sidewall.  The single hyperfluorescence node was taken in this packet, and set aside, labeled the left external iliac lymph node, sentinel.  Left obturator group was then carefully defined and with dissection boundaries being the left obturator nerve, pelvic side wall with the external iliac vein, and set aside labeled left external iliac lymph nodes.  Attention was then directed at the bladder neck dissection keeping the prostate in a gentle anterior traction.  The bladder neck was identified by moving the Foley catheter back and forth.  Dissection was proceeded in the anterior-posterior direction, separating the bladder neck away from the base of the prostate, which resulted in approximately quarter to half dollar size of bladder neck.  Posterior dissection was performed by incising approximately 8 mm inferior- posterior to posterior lip of the bladder neck dissecting directly posteriorly.  The space of adrenal VA  was entered, bilateral vas deferens were seen, dissected for distance approximately 4 cm ligated and placed on gentle superior traction.  The left seminal vesicles were carefully dissected to its tip, also placed on gentle superior traction as was the right seminal vesicle.  The plane of Dennonviller was further developed at the base to apex orientation, which then defined the prostatic pedicles bilaterally.  Partial nerve sparing was performed on the left side, sweeping the presumed neurovascular bundle away from the lateral and apical portions of the  prostate and cold clipping was performed of the vascular pedicle on the left side from the base to the apex.  On the right side, there was a predominant cancer as per prior biopsy.  Purposeful wide dissection was performed without nerve sparing of this prostatic pedicle on the right side.  This successfully exposed the membranous urethra, which was carefully transected leaving what appeared to be adequate urethral stump, this completely freed up the radical prostatectomy.  Specimen was placed to the EndoCatch bag for later retrieval.  Next, rectal exam was performed using indicator glove by the surgeon and no rectal violation was encountered.  Next, attention was directed to the posterior reconstruction.  The posterior urethral plate was carefully reapproximated to the posterior bladder neck, fascia using figure-of-eight 12 running V-Loc suture.  This brought the posterior bladder neck and membranous urethra into tension-free apposition.  Given the size of the bladder neck, being slightly larger caliber than desired, bladder neck reconstruction was performed, figure-of-eight 2-0 Vicryl suture was placed both at the 3 o'clock and 9 o'clock positions on the bladder neck, it was then resulted in approximately dime-sized bladder neck.  Mucosa-to-mucosa anastomosis was performed using double- armed V-Loc suture from the 6 o'clock to 12 o'clock position.  A new Foley catheter was placed, which irrigated quantitatively.  Anterior reconstruction was performed by anchoring the previously anastomotic suture to the previous prosthetic ligament.  Hemostasis appeared excellent.  All sponge and needle counts were correct.  Close suction drain was brought through the previous left lateral most robotic port site.  Specimen was retrieved by extending the previous camera port site inferiorly and superiorly for total distance of approximately 4 cm and removing the specimen and setting it aside for permanent  pathology.  The extraction site inherently encompassed the previous umbilical fascial defect, which were carefully further defined and mobilized.  This site was closed, thus performing umbilical hernia repair using figure-of- eight Prolene x6.  This resulted in complete resolution of palpable fascial defect at the level of the umbilicus.  Scarpa was reapproximated using running Vicryl.  All skin incisions were closed using subcuticular Monocryl followed by Dermabond.  Procedure was terminated.  The patient tolerated the procedure well.  There were no immediate periprocedural complications.  The patient was taken to the postanesthesia care unit in stable condition.          ______________________________ Alexis Frock, MD     TM/MEDQ  D:  08/06/2013  T:  08/07/2013  Job:  [id number]

## 2013-08-08 LAB — GLUCOSE, CAPILLARY: Glucose-Capillary: 160 mg/dL — ABNORMAL HIGH (ref 70–99)

## 2013-08-08 MED ORDER — SENNOSIDES-DOCUSATE SODIUM 8.6-50 MG PO TABS: 1.0000 | ORAL_TABLET | Freq: Two times a day (BID) | ORAL | Status: DC

## 2013-08-08 NOTE — Discharge Summary (Signed)
Physician Discharge Summary  Patient ID: Arthur Carr MRN: [id number] DOB/AGE: 1951-01-19 63 y.o.  Admit date: 08/06/2013 Discharge date: 08/08/2013  Admission Diagnoses: Moderate Risk Prostate Cancer  Discharge Diagnoses: Moderate Risk Prostate Cancer Active Problems:   Prostate cancer   Discharged Condition: good  Hospital Course:   1 - Moderate Risk Prostate Cancer - s/p robotic prostatectomy + bilateral pelvic lymph node dissection (template + ICG sentinal) + umbilical hernia repair. JP Cr POD 1.1 (same as serum) and removed POD 2. By 1/16, the day of discharge, tolerating regular diet, pain controlled, ambulatory.   Consults: None  Significant Diagnostic Studies: labs: pathology - pending at discharge, Jp Cr 1.1.  Treatments: surgery: robotic prostatectomy + bilateral pelvic lymph node dissection (template + ICG sentinal) + umbilical hernia repair.  Discharge Exam: Blood pressure 163/84, pulse 103, temperature 99.6 F (37.6 C), temperature source Oral, resp. rate 18, height 5' 10.5" (1.791 m), weight 104.4 kg (230 lb 2.6 oz), SpO2 97.00%. General appearance: alert, cooperative and appears stated age Head: Normocephalic, without obvious abnormality, atraumatic Eyes: conjunctivae/corneas clear. PERRL, EOM's intact. Fundi benign. Ears: normal TM's and external ear canals both ears Nose: Nares normal. Septum midline. Mucosa normal. No drainage or sinus tenderness. Throat: lips, mucosa, and tongue normal; teeth and gums normal Neck: no adenopathy, no carotid bruit, no JVD, supple, symmetrical, trachea midline and thyroid not enlarged, symmetric, no tenderness/mass/nodules Back: symmetric, no curvature. ROM normal. No CVA tenderness. Resp: clear to auscultation bilaterally Chest wall: no tenderness Cardio: regular rate and rhythm, S1, S2 normal, no murmur, click, rub or gallop GI: soft, non-tender; bowel sounds normal; no masses,  no organomegaly Male genitalia: normal,  foley c/d/i with clear yellow urine. Extremities: extremities normal, atraumatic, no cyanosis or edema Pulses: 2+ and symmetric Skin: Skin color, texture, turgor normal. No rashes or lesions Lymph nodes: Cervical, supraclavicular, and axillary nodes normal. Neurologic: Grossly normal WOUND: JP site c/d/i with serous drainage, removed. All port sites and extraction sites c/d/i.   Disposition:      Medication List         amLODipine 5 MG tablet  Commonly known as:  NORVASC  Take 5 mg by mouth daily.     benazepril 40 MG tablet  Commonly known as:  LOTENSIN  Take 40 mg by mouth daily with breakfast.     ciprofloxacin 500 MG tablet  Commonly known as:  CIPRO  Take 1 tablet (500 mg total) by mouth 2 (two) times daily. Start day prior to office visit for foley removal     furosemide 80 MG tablet  Commonly known as:  LASIX  Take 80 mg by mouth 2 (two) times daily.     glipiZIDE 5 MG tablet  Commonly known as:  GLUCOTROL  Take 5 mg by mouth daily before breakfast.     HYDROcodone-acetaminophen 5-325 MG per tablet  Commonly known as:  NORCO  Take 1-2 tablets by mouth every 6 (six) hours as needed.     metFORMIN 500 MG tablet  Commonly known as:  GLUCOPHAGE  Take 500 mg by mouth 2 (two) times daily with a meal.     pravastatin 20 MG tablet  Commonly known as:  PRAVACHOL  Take 20 mg by mouth daily.     senna-docusate 8.6-50 MG per tablet  Commonly known as:  Senokot-S  Take 1 tablet by mouth 2 (two) times daily. While taking pain meds to prevent constipation           Follow-up Information  Follow up with Alexis Frock, MD On 08/12/2013. (at 9:00)    Specialty:  Urology   Contact information:   [facility]  [place] [address] [phone]       Signed: Alexis Frock 08/08/2013, 7:08 AM

## 2013-11-07 ENCOUNTER — Inpatient Hospital Stay (HOSPITAL_COMMUNITY)
Admission: EM | Admit: 2013-11-07 | Discharge: 2013-11-09 | DRG: 640 | Disposition: A | Discharge status: Home / Self Care | Payer: Medicare Other | Attending: Internal Medicine | Admitting: Internal Medicine

## 2013-11-07 ENCOUNTER — Encounter (HOSPITAL_COMMUNITY): Payer: Self-pay | Admitting: Emergency Medicine

## 2013-11-07 ENCOUNTER — Inpatient Hospital Stay (HOSPITAL_COMMUNITY): Payer: Medicare Other

## 2013-11-07 DIAGNOSIS — R8271 Bacteriuria: Secondary | ICD-10-CM | POA: Diagnosis present

## 2013-11-07 DIAGNOSIS — J189 Pneumonia, unspecified organism: Secondary | ICD-10-CM | POA: Diagnosis present

## 2013-11-07 DIAGNOSIS — I10 Essential (primary) hypertension: Secondary | ICD-10-CM | POA: Diagnosis present

## 2013-11-07 DIAGNOSIS — Z79899 Other long term (current) drug therapy: Secondary | ICD-10-CM

## 2013-11-07 DIAGNOSIS — F17200 Nicotine dependence, unspecified, uncomplicated: Secondary | ICD-10-CM | POA: Diagnosis present

## 2013-11-07 DIAGNOSIS — E872 Acidosis, unspecified: Principal | ICD-10-CM | POA: Diagnosis present

## 2013-11-07 DIAGNOSIS — E119 Type 2 diabetes mellitus without complications: Secondary | ICD-10-CM | POA: Diagnosis present

## 2013-11-07 DIAGNOSIS — Z8546 Personal history of malignant neoplasm of prostate: Secondary | ICD-10-CM

## 2013-11-07 DIAGNOSIS — E876 Hypokalemia: Secondary | ICD-10-CM | POA: Diagnosis present

## 2013-11-07 DIAGNOSIS — I872 Venous insufficiency (chronic) (peripheral): Secondary | ICD-10-CM | POA: Diagnosis present

## 2013-11-07 DIAGNOSIS — N12 Tubulo-interstitial nephritis, not specified as acute or chronic: Secondary | ICD-10-CM

## 2013-11-07 DIAGNOSIS — Z96649 Presence of unspecified artificial hip joint: Secondary | ICD-10-CM

## 2013-11-07 DIAGNOSIS — R509 Fever, unspecified: Secondary | ICD-10-CM | POA: Diagnosis present

## 2013-11-07 DIAGNOSIS — C61 Malignant neoplasm of prostate: Secondary | ICD-10-CM

## 2013-11-07 LAB — I-STAT CHEM 8, ED
BUN: 10 mg/dL (ref 6–23)
CHLORIDE: 98 meq/L (ref 96–112)
Calcium, Ion: 1.15 mmol/L (ref 1.13–1.30)
Creatinine, Ser: 1.5 mg/dL — ABNORMAL HIGH (ref 0.50–1.35)
Glucose, Bld: 167 mg/dL — ABNORMAL HIGH (ref 70–99)
HCT: 45 % (ref 39.0–52.0)
HEMOGLOBIN: 15.3 g/dL (ref 13.0–17.0)
Potassium: 3.2 mEq/L — ABNORMAL LOW (ref 3.7–5.3)
SODIUM: 139 meq/L (ref 137–147)
TCO2: 24 mmol/L (ref 0–100)

## 2013-11-07 LAB — CREATININE, SERUM
CREATININE: 1.32 mg/dL (ref 0.50–1.35)
GFR calc non Af Amer: 55 mL/min — ABNORMAL LOW (ref >90)
GFR, EST AFRICAN AMERICAN: 64 mL/min — AB (ref >90)

## 2013-11-07 LAB — URINE MICROSCOPIC-ADD ON

## 2013-11-07 LAB — URINALYSIS, ROUTINE W REFLEX MICROSCOPIC
Bilirubin Urine: NEGATIVE
Glucose, UA: 250 mg/dL — AB
Ketones, ur: NEGATIVE mg/dL
NITRITE: NEGATIVE
Protein, ur: >300 mg/dL — AB
SPECIFIC GRAVITY, URINE: 1.027 (ref 1.005–1.030)
UROBILINOGEN UA: 1 mg/dL (ref 0.0–1.0)
pH: 6.5 (ref 5.0–8.0)

## 2013-11-07 LAB — CBC
HCT: 37.9 % — ABNORMAL LOW (ref 39.0–52.0)
Hemoglobin: 13.5 g/dL (ref 13.0–17.0)
MCH: 31.5 pg (ref 26.0–34.0)
MCHC: 35.6 g/dL (ref 30.0–36.0)
MCV: 88.3 fL (ref 78.0–100.0)
PLATELETS: 158 10*3/uL (ref 150–400)
RBC: 4.29 MIL/uL (ref 4.22–5.81)
RDW: 13.2 % (ref 11.5–15.5)
WBC: 11 10*3/uL — AB (ref 4.0–10.5)

## 2013-11-07 LAB — CBC WITH DIFFERENTIAL/PLATELET
Basophils Absolute: 0 10*3/uL (ref 0.0–0.1)
Basophils Relative: 0 % (ref 0–1)
EOS ABS: 0 10*3/uL (ref 0.0–0.7)
Eosinophils Relative: 0 % (ref 0–5)
HCT: 42.2 % (ref 39.0–52.0)
Hemoglobin: 15.1 g/dL (ref 13.0–17.0)
LYMPHS ABS: 0.7 10*3/uL (ref 0.7–4.0)
LYMPHS PCT: 9 % — AB (ref 12–46)
MCH: 31.6 pg (ref 26.0–34.0)
MCHC: 35.8 g/dL (ref 30.0–36.0)
MCV: 88.3 fL (ref 78.0–100.0)
Monocytes Absolute: 0.1 10*3/uL (ref 0.1–1.0)
Monocytes Relative: 2 % — ABNORMAL LOW (ref 3–12)
NEUTROS ABS: 6.8 10*3/uL (ref 1.7–7.7)
NEUTROS PCT: 89 % — AB (ref 43–77)
PLATELETS: 165 10*3/uL (ref 150–400)
RBC: 4.78 MIL/uL (ref 4.22–5.81)
RDW: 13 % (ref 11.5–15.5)
WBC: 7.6 10*3/uL (ref 4.0–10.5)

## 2013-11-07 LAB — GLUCOSE, CAPILLARY: GLUCOSE-CAPILLARY: 159 mg/dL — AB (ref 70–99)

## 2013-11-07 LAB — I-STAT CG4 LACTIC ACID, ED: Lactic Acid, Venous: 3.52 mmol/L — ABNORMAL HIGH (ref 0.5–2.2)

## 2013-11-07 MED ORDER — ALUM & MAG HYDROXIDE-SIMETH 200-200-20 MG/5ML PO SUSP: 30.0000 mL | Freq: Four times a day (QID) | ORAL | Status: DC | PRN

## 2013-11-07 MED ORDER — SENNOSIDES-DOCUSATE SODIUM 8.6-50 MG PO TABS
1.0000 | ORAL_TABLET | Freq: Every evening | ORAL | Status: DC | PRN
Start: 2013-11-07 — End: 2013-11-09

## 2013-11-07 MED ORDER — ACETAMINOPHEN 325 MG PO TABS
650.0000 mg | ORAL_TABLET | Freq: Four times a day (QID) | ORAL | Status: DC | PRN
  Administered 2013-11-08: 650 mg via ORAL
  Filled 2013-11-07: qty 2

## 2013-11-07 MED ORDER — PROMETHAZINE HCL 25 MG PO TABS: 12.5000 mg | ORAL_TABLET | Freq: Four times a day (QID) | ORAL | Status: DC | PRN

## 2013-11-07 MED ORDER — POTASSIUM CHLORIDE IN NACL 20-0.9 MEQ/L-% IV SOLN
INTRAVENOUS | Status: DC
Start: 2013-11-07 — End: 2013-11-09
  Administered 2013-11-07 – 2013-11-09 (×4): via INTRAVENOUS
  Filled 2013-11-07 (×7): qty 1000

## 2013-11-07 MED ORDER — SODIUM CHLORIDE 0.9 % IV BOLUS (SEPSIS)
1000.0000 mL | Freq: Once | INTRAVENOUS | Status: AC
  Administered 2013-11-07: 1000 mL via INTRAVENOUS

## 2013-11-07 MED ORDER — CIPROFLOXACIN IN D5W 400 MG/200ML IV SOLN
400.0000 mg | Freq: Two times a day (BID) | INTRAVENOUS | Status: DC
  Administered 2013-11-07 – 2013-11-09 (×4): 400 mg via INTRAVENOUS
  Filled 2013-11-07 (×5): qty 200

## 2013-11-07 MED ORDER — SODIUM CHLORIDE 0.9 % IJ SOLN
3.0000 mL | Freq: Two times a day (BID) | INTRAMUSCULAR | Status: DC
  Administered 2013-11-07 – 2013-11-08 (×2): 3 mL via INTRAVENOUS

## 2013-11-07 MED ORDER — AMLODIPINE BESYLATE 5 MG PO TABS
5.0000 mg | ORAL_TABLET | Freq: Every day | ORAL | Status: DC
  Administered 2013-11-08 – 2013-11-09 (×2): 5 mg via ORAL
  Filled 2013-11-07 (×2): qty 1

## 2013-11-07 MED ORDER — GLIPIZIDE 5 MG PO TABS
5.0000 mg | ORAL_TABLET | Freq: Every day | ORAL | Status: DC
  Administered 2013-11-08 – 2013-11-09 (×2): 5 mg via ORAL
  Filled 2013-11-07 (×3): qty 1

## 2013-11-07 MED ORDER — ENOXAPARIN SODIUM 40 MG/0.4ML ~~LOC~~ SOLN
40.0000 mg | SUBCUTANEOUS | Status: DC
  Administered 2013-11-07 – 2013-11-08 (×2): 40 mg via SUBCUTANEOUS
  Filled 2013-11-07 (×3): qty 0.4

## 2013-11-07 MED ORDER — DEXTROSE 5 % IV SOLN
1.0000 g | INTRAVENOUS | Status: DC
  Administered 2013-11-07 – 2013-11-08 (×2): 1 g via INTRAVENOUS
  Filled 2013-11-07 (×3): qty 10

## 2013-11-07 MED ORDER — ACETAMINOPHEN 650 MG RE SUPP: 650.0000 mg | Freq: Four times a day (QID) | RECTAL | Status: DC | PRN

## 2013-11-07 MED ORDER — INSULIN ASPART 100 UNIT/ML ~~LOC~~ SOLN
0.0000 [IU] | Freq: Every day | SUBCUTANEOUS | Status: DC
  Administered 2013-11-08: 3 [IU] via SUBCUTANEOUS

## 2013-11-07 MED ORDER — SODIUM CHLORIDE 0.9 % IV SOLN: Freq: Once | INTRAVENOUS | Status: DC

## 2013-11-07 MED ORDER — INSULIN ASPART 100 UNIT/ML ~~LOC~~ SOLN
0.0000 [IU] | Freq: Three times a day (TID) | SUBCUTANEOUS | Status: DC
  Administered 2013-11-08 – 2013-11-09 (×4): 3 [IU] via SUBCUTANEOUS

## 2013-11-07 NOTE — ED Notes (Signed)
Dr Alvino Chapel does not feel pt needs cooling measures at this time.  He does not want to give pt ibuprofen at this time d/t decreased kidney function.  Pt given cool cloth on forehead.

## 2013-11-07 NOTE — ED Notes (Signed)
MD at bedside.-[place] 

## 2013-11-07 NOTE — ED Notes (Signed)
i-stat CG4+ result 3.52 mmol/L result given to Dr. Alvino Chapel

## 2013-11-07 NOTE — ED Notes (Signed)
Admitting note states admission for lactic acidosis

## 2013-11-07 NOTE — Progress Notes (Signed)
Called for report. RN to call back.

## 2013-11-07 NOTE — ED Notes (Addendum)
Urinary sx began last night and had difficulty and burning with urination and flank pain. PT also took 2 extra strength Tylenol this AM and 2 tylenol right before EMS arrived.

## 2013-11-07 NOTE — H&P (Signed)
Arthur Carr is an 63 y.o. male.   Chief Complaint: fever and weakness HPI:  The patient is a 63 year old African-American man who was in his usual state of fairly good health until earlier today when he began to have dysuria.  He was seen by a nurse practitioner at his urologist office with urinalysis suggesting a urinary tract infection, and he was given a prescription for ciprofloxacin. Before getting his prescription he developed significant fever and chills, so he presented to the emergency room for evaluation.  He had been having frequency with dysuria over the past 2 days.  He has not had productive cough, shortness of breath, abdominal pain, nausea, vomiting, diarrhea, or constipation.  He has not had any recent new medications as well.  Past Medical History  Diagnosis Date  . Hypertension   . Arthritis   . Prostate cancer   . Diabetes mellitus without complication      (Not in a hospital admission)  ADDITIONAL HOME MEDICATIONS: No additional home medications  PHYSICIANS INVOLVED IN CARE: Tonna Boehringer (PCP), Phebe Colla (urologist)  Past Surgical History  Procedure Laterality Date  . Joint replacement  2007    RT TOTAL HIP  . Robot assisted laparoscopic radical prostatectomy N/A 08/06/2013    Procedure: ROBOTIC ASSISTED LAPAROSCOPIC RADICAL PROSTATECTOMY/OPEN UMBILICAL HERNIA REPAIR;  Surgeon: Alexis Frock, MD;  Location: [facility];  Service: Urology;  Laterality: N/A;  . Lymphadenectomy Bilateral 08/06/2013    Procedure: LYMPHADENECTOMY AND INDOCYANINE GREEN DYE  INJECTION;  Surgeon: Alexis Frock, MD;  Location: [facility];  Service: Urology;  Laterality: Bilateral;    History reviewed. No pertinent family history.   Social History:  reports that he has been smoking.  He has quit using smokeless tobacco. He reports that he does not drink alcohol or use illicit drugs.  Allergies: No Known Allergies   ROS: ankle swelling, arthritis, diabetes and high blood  pressure  PHYSICAL EXAM: Blood pressure 145/76, pulse 102, temperature 101.3 F (38.5 C), temperature source Oral, resp. rate 24, SpO2 97.00%. In general, the patient is an overweight African-American man who had a wet cloth on his face, and was otherwise in no apparent distress.  HEENT exam was within normal limits, neck was supple without jugular venous distention or carotid bruit, chest was clear to auscultation, heart had a regular rate and rhythm and was without significant murmur or gallop, abdomen had normal bowel sounds and no hepatosplenomegaly or tenderness, extremities had bilateral 2+ pitting edema (left greater than right) and bilateral Homans test was normal with no palpable venous cords in the lower extremities.  He was alert and well oriented with a normal affect and was able to move all extremities well.  Results for orders placed during the hospital encounter of 11/07/13 (from the past 48 hour(s))  CBC WITH DIFFERENTIAL     Status: Abnormal   Collection Time    11/07/13  6:30 PM      Result Value Ref Range   WBC 7.6  4.0 - 10.5 K/uL   RBC 4.78  4.22 - 5.81 MIL/uL   Hemoglobin 15.1  13.0 - 17.0 g/dL   HCT 42.2  39.0 - 52.0 %   MCV 88.3  78.0 - 100.0 fL   MCH 31.6  26.0 - 34.0 pg   MCHC 35.8  30.0 - 36.0 g/dL   RDW 13.0  11.5 - 15.5 %   Platelets 165  150 - 400 K/uL   Neutrophils Relative % 89 (*) 43 - 77 %  Neutro Abs 6.8  1.7 - 7.7 K/uL   Lymphocytes Relative 9 (*) 12 - 46 %   Lymphs Abs 0.7  0.7 - 4.0 K/uL   Monocytes Relative 2 (*) 3 - 12 %   Monocytes Absolute 0.1  0.1 - 1.0 K/uL   Eosinophils Relative 0  0 - 5 %   Eosinophils Absolute 0.0  0.0 - 0.7 K/uL   Basophils Relative 0  0 - 1 %   Basophils Absolute 0.0  0.0 - 0.1 K/uL  I-STAT CHEM 8, ED     Status: Abnormal   Collection Time    11/07/13  6:41 PM      Result Value Ref Range   Sodium 139  137 - 147 mEq/L   Potassium 3.2 (*) 3.7 - 5.3 mEq/L   Chloride 98  96 - 112 mEq/L   BUN 10  6 - 23 mg/dL    Creatinine, Ser 1.50 (*) 0.50 - 1.35 mg/dL   Glucose, Bld 167 (*) 70 - 99 mg/dL   Calcium, Ion 1.15  1.13 - 1.30 mmol/L   TCO2 24  0 - 100 mmol/L   Hemoglobin 15.3  13.0 - 17.0 g/dL   HCT 45.0  39.0 - 52.0 %  I-STAT CG4 LACTIC ACID, ED     Status: Abnormal   Collection Time    11/07/13  6:42 PM      Result Value Ref Range   Lactic Acid, Venous 3.52 (*) 0.5 - 2.2 mmol/L  URINALYSIS, ROUTINE W REFLEX MICROSCOPIC     Status: Abnormal   Collection Time    11/07/13  7:01 PM      Result Value Ref Range   Color, Urine YELLOW  YELLOW   APPearance CLOUDY (*) CLEAR   Specific Gravity, Urine 1.027  1.005 - 1.030   pH 6.5  5.0 - 8.0   Glucose, UA 250 (*) NEGATIVE mg/dL   Hgb urine dipstick LARGE (*) NEGATIVE   Bilirubin Urine NEGATIVE  NEGATIVE   Ketones, ur NEGATIVE  NEGATIVE mg/dL   Protein, ur >300 (*) NEGATIVE mg/dL   Urobilinogen, UA 1.0  0.0 - 1.0 mg/dL   Nitrite NEGATIVE  NEGATIVE   Leukocytes, UA SMALL (*) NEGATIVE  URINE MICROSCOPIC-ADD ON     Status: Abnormal   Collection Time    11/07/13  7:01 PM      Result Value Ref Range   Squamous Epithelial / LPF FEW (*) RARE   WBC, UA 3-6  <3 WBC/hpf   RBC / HPF 21-50  <3 RBC/hpf   Bacteria, UA RARE  RARE   Urine-Other AMORPHOUS URATES/PHOSPHATES     Dg Chest 2 View  11/07/2013   CLINICAL DATA:  Fever, hypertension.  EXAM: CHEST  2 VIEW  COMPARISON:  DG CHEST 2 VIEW dated 08/24/2005  FINDINGS: Mild elevation of the left hemidiaphragm. Left lower lobe atelectasis or infiltrate. Right lung is clear. Heart is normal size. No effusions or acute bony abnormality.  IMPRESSION: Mild elevation of the left hemidiaphragm with left lower lobe atelectasis or infiltrate.   Electronically Signed   By: Rolm Baptise M.D.   On: 11/07/2013 21:00     Assessment/Plan #1 Lactic Acidosis:  Most likely from infection (pneumonia versus urinary tract infection)  and he is relatively stable with good blood pressure and no signs of organ failure.  We will obtain  urine cultures as well as blood cultures, and begin treatment with Rocephin and ciprofloxacin.  We will also give him moderate  dose IV fluids and hold his ACE inhibitor for now. #2 Bilateral Leg Edema: this is a chronic issue for him and likely from chronic venous insufficiency.  Given that DVT and pulmonary embolism could cause fever without leukocytosis we will check a bilateral DVT ultrasound exam and continue Lovenox treatment.  The likelihood of DVT is relatively low so we will not place him on a full therapeutic dose of Lovenox at this time. #3 Hypertension: stable on current medications. #4 Diabetes Mellitus, type 2: stable and we will add sliding scale insulin to his regimen.  Leanna Battles 11/07/2013, 9:26 PM

## 2013-11-07 NOTE — Progress Notes (Signed)
  Pt admitted to the unit. Pt is stable, alert and oriented per baseline. Oriented to room, staff, and call bell. Educated to call for any assistance. Bed in lowest position, call bell within reach- will continue to monitor. 

## 2013-11-07 NOTE — ED Notes (Signed)
Hx of recent UTI from doc office today. Abx shot this AM at providers office and prescribed PO abx but PT hasn't picked them up yet. Sitting in chair watching TV had uncontrolled shaking/chills. Hx HTN, DM. 103.7 F Oral BP 152/81, P 125, RR 20, 94% RA, CBG 167.

## 2013-11-07 NOTE — ED Provider Notes (Signed)
CSN: [id number]     Arrival date & time 11/07/13  1806 History   First MD Initiated Contact with Patient 11/07/13 1814     Chief Complaint  Patient presents with  . Fever  . Chills  . Urinary Tract Infection     (Consider location/radiation/quality/duration/timing/severity/associated sxs/prior Treatment) Patient is a 63 y.o. male presenting with fever and urinary tract infection. The history is provided by the patient.  Fever Associated symptoms: chills and dysuria   Associated symptoms: no chest pain, no diarrhea, no headaches, no nausea, no rash and no vomiting   Urinary Tract Infection Pertinent negatives include no chest pain, no abdominal pain, no headaches and no shortness of breath.   patient presents with fever and chills. He was seen in his urologist, Dr. Tresa Moore earlier today. He states he was told it urinary tract infection. He was given a shot of antibiotics and given a prescription for Avelox. He states he had fever up to 101 at that time. While he was at home later he developed chills. Had a fever 103. No chest pain. No cough. He has had some dysuria, flank pain. No nausea or vomiting. He states he feels fatigued. He is take an extra Tylenol 4 times today.   Past Medical History  Diagnosis Date  . Hypertension   . Arthritis   . Prostate cancer   . Diabetes mellitus without complication    Past Surgical History  Procedure Laterality Date  . Joint replacement  2007    RT TOTAL HIP  . Robot assisted laparoscopic radical prostatectomy N/A 08/06/2013    Procedure: ROBOTIC ASSISTED LAPAROSCOPIC RADICAL PROSTATECTOMY/OPEN UMBILICAL HERNIA REPAIR;  Surgeon: Alexis Frock, MD;  Location: [facility];  Service: Urology;  Laterality: N/A;  . Lymphadenectomy Bilateral 08/06/2013    Procedure: LYMPHADENECTOMY AND INDOCYANINE GREEN DYE  INJECTION;  Surgeon: Alexis Frock, MD;  Location: [facility];  Service: Urology;  Laterality: Bilateral;   History reviewed. No pertinent family  history. History  Substance Use Topics  . Smoking status: Current Every Day Smoker -- 0.50 packs/day  . Smokeless tobacco: Former Systems developer  . Alcohol Use: No    Review of Systems  Constitutional: Positive for fever, chills and appetite change. Negative for activity change.  Eyes: Negative for pain.  Respiratory: Negative for chest tightness and shortness of breath.   Cardiovascular: Negative for chest pain and leg swelling.  Gastrointestinal: Negative for nausea, vomiting, abdominal pain and diarrhea.  Genitourinary: Positive for dysuria and flank pain.  Musculoskeletal: Negative for back pain and neck stiffness.  Skin: Negative for rash.  Neurological: Negative for weakness, numbness and headaches.  Psychiatric/Behavioral: Negative for behavioral problems.      Allergies  Review of patient's allergies indicates no known allergies.  Home Medications   Prior to Admission medications   Medication Sig Start Date End Date Taking? Authorizing Provider  amLODipine (NORVASC) 5 MG tablet Take 5 mg by mouth daily.   Yes Historical Provider, MD  benazepril (LOTENSIN) 40 MG tablet Take 40 mg by mouth daily with breakfast.   Yes Historical Provider, MD  furosemide (LASIX) 80 MG tablet Take 80 mg by mouth 2 (two) times daily.   Yes Historical Provider, MD  glipiZIDE (GLUCOTROL) 5 MG tablet Take 5 mg by mouth daily before breakfast.   Yes Historical Provider, MD  metFORMIN (GLUCOPHAGE) 500 MG tablet Take 500 mg by mouth 2 (two) times daily with a meal.   Yes Historical Provider, MD  pravastatin (PRAVACHOL) 20 MG tablet Take  20 mg by mouth daily.   Yes Historical Provider, MD   BP 143/69  Pulse 108  Temp(Src) 101.3 F (38.5 C) (Oral)  Resp 27  SpO2 98% Physical Exam  Nursing note and vitals reviewed. Constitutional: He is oriented to person, place, and time. He appears well-developed and well-nourished.  HENT:  Head: Normocephalic and atraumatic.  Cardiovascular: Regular rhythm and  normal heart sounds.   No murmur heard. Tachycardia  Pulmonary/Chest: Effort normal and breath sounds normal.  Abdominal: Soft. Bowel sounds are normal. He exhibits no distension and no mass. There is no tenderness. There is no rebound and no guarding.  Genitourinary:  CVA tenderness on left  Musculoskeletal: Normal range of motion. He exhibits no edema.  Neurological: He is alert and oriented to person, place, and time. No cranial nerve deficit.  Skin: Skin is warm and dry.  Patient feels warm.  Psychiatric: He has a normal mood and affect.    ED Course  Procedures (including critical care time) Labs Review Labs Reviewed  CBC WITH DIFFERENTIAL - Abnormal; Notable for the following:    Neutrophils Relative % 89 (*)    Lymphocytes Relative 9 (*)    Monocytes Relative 2 (*)    All other components within normal limits  URINALYSIS, ROUTINE W REFLEX MICROSCOPIC - Abnormal; Notable for the following:    APPearance CLOUDY (*)    Glucose, UA 250 (*)    Hgb urine dipstick LARGE (*)    Protein, ur >300 (*)    Leukocytes, UA SMALL (*)    All other components within normal limits  URINE MICROSCOPIC-ADD ON - Abnormal; Notable for the following:    Squamous Epithelial / LPF FEW (*)    All other components within normal limits  I-STAT CHEM 8, ED - Abnormal; Notable for the following:    Potassium 3.2 (*)    Creatinine, Ser 1.50 (*)    Glucose, Bld 167 (*)    All other components within normal limits  I-STAT CG4 LACTIC ACID, ED - Abnormal; Notable for the following:    Lactic Acid, Venous 3.52 (*)    All other components within normal limits  URINE CULTURE    Imaging Review No results found.   EKG Interpretation None      MDM   Final diagnoses:  Pyelonephritis    Patient with urinary tract infection. Lactic acid elevated. Febrile. Not hypertensive. Will admit to medicine.    Jasper Riling. Alvino Chapel, MD 11/07/13 2040

## 2013-11-08 DIAGNOSIS — M7989 Other specified soft tissue disorders: Secondary | ICD-10-CM

## 2013-11-08 LAB — COMPREHENSIVE METABOLIC PANEL
ALT: 21 U/L (ref 0–53)
AST: 25 U/L (ref 0–37)
Albumin: 3 g/dL — ABNORMAL LOW (ref 3.5–5.2)
Alkaline Phosphatase: 56 U/L (ref 39–117)
BUN: 13 mg/dL (ref 6–23)
CO2: 25 mEq/L (ref 19–32)
CREATININE: 1.47 mg/dL — AB (ref 0.50–1.35)
Calcium: 8.7 mg/dL (ref 8.4–10.5)
Chloride: 101 mEq/L (ref 96–112)
GFR calc Af Amer: 56 mL/min — ABNORMAL LOW (ref >90)
GFR calc non Af Amer: 49 mL/min — ABNORMAL LOW (ref >90)
Glucose, Bld: 189 mg/dL — ABNORMAL HIGH (ref 70–99)
Potassium: 3.4 mEq/L — ABNORMAL LOW (ref 3.7–5.3)
SODIUM: 140 meq/L (ref 137–147)
TOTAL PROTEIN: 6.6 g/dL (ref 6.0–8.3)
Total Bilirubin: 0.8 mg/dL (ref 0.3–1.2)

## 2013-11-08 LAB — HEMOGLOBIN A1C
Hgb A1c MFr Bld: 7.2 % — ABNORMAL HIGH (ref <5.7)
MEAN PLASMA GLUCOSE: 160 mg/dL — AB (ref <117)

## 2013-11-08 LAB — CBC
HEMATOCRIT: 37.8 % — AB (ref 39.0–52.0)
Hemoglobin: 13.4 g/dL (ref 13.0–17.0)
MCH: 31.5 pg (ref 26.0–34.0)
MCHC: 35.4 g/dL (ref 30.0–36.0)
MCV: 88.9 fL (ref 78.0–100.0)
Platelets: 157 10*3/uL (ref 150–400)
RBC: 4.25 MIL/uL (ref 4.22–5.81)
RDW: 13.3 % (ref 11.5–15.5)
WBC: 11.8 10*3/uL — ABNORMAL HIGH (ref 4.0–10.5)

## 2013-11-08 LAB — GLUCOSE, CAPILLARY
GLUCOSE-CAPILLARY: 174 mg/dL — AB (ref 70–99)
GLUCOSE-CAPILLARY: 259 mg/dL — AB (ref 70–99)
Glucose-Capillary: 157 mg/dL — ABNORMAL HIGH (ref 70–99)
Glucose-Capillary: 182 mg/dL — ABNORMAL HIGH (ref 70–99)

## 2013-11-08 MED ORDER — IBUPROFEN 100 MG/5ML PO SUSP
200.0000 mg | Freq: Once | ORAL | Status: DC
  Filled 2013-11-08: qty 10

## 2013-11-08 MED ORDER — IBUPROFEN 100 MG PO CHEW
200.0000 mg | CHEWABLE_TABLET | Freq: Two times a day (BID) | ORAL | Status: DC | PRN
  Filled 2013-11-08: qty 2

## 2013-11-08 MED ORDER — IBUPROFEN 200 MG PO TABS
200.0000 mg | ORAL_TABLET | Freq: Two times a day (BID) | ORAL | Status: DC | PRN
  Filled 2013-11-08: qty 1

## 2013-11-08 MED ORDER — IBUPROFEN 200 MG PO TABS
200.0000 mg | ORAL_TABLET | Freq: Once | ORAL | Status: AC
  Administered 2013-11-08: 200 mg via ORAL
  Filled 2013-11-08: qty 1

## 2013-11-08 NOTE — Progress Notes (Signed)
2 tabs of tylenol has been given to help break fever. Awaiting call from MD.

## 2013-11-08 NOTE — Progress Notes (Signed)
Verbal orders from MD Terance Hart- 200mg  of ibuprofen one time dose. Putting in orders now. Will continue to monitor.

## 2013-11-08 NOTE — Progress Notes (Signed)
Bilateral lower extremity venous duplex:  No evidence of DVT, superficial thrombosis, or Baker's Cyst.   

## 2013-11-08 NOTE — Progress Notes (Signed)
Subjective: Have high fever and chills last night, none this morning, ate well for breakfast.  Objective: Vital signs in last 24 hours: Temp:  [98.3 F (36.8 C)-103.5 F (39.7 C)] 98.3 F (36.8 C) (04/18 0500) Pulse Rate:  [79-117] 79 (04/18 0823) Resp:  [20-31] 20 (04/18 0500) BP: (133-170)/(56-91) 133/69 mmHg (04/18 0823) SpO2:  [96 %-98 %] 96 % (04/18 0500) Weight:  [103.4 kg (227 lb 15.3 oz)] 103.4 kg (227 lb 15.3 oz) (04/17 2146) Weight change:    Intake/Output from previous day: 04/17 0701 - 04/18 0700 In: -  Out: 1 [Urine:1]   General appearance: alert, cooperative and no distress Resp: clear to auscultation bilaterally Cardio: regular rate and rhythm GI: soft, non-tender; bowel sounds normal; no masses,  no organomegaly Extremities: bilateral 2+ leg edema  Lab Results:  Recent Labs  11/07/13 2259 11/08/13 0525  WBC 11.0* 11.8*  HGB 13.5 13.4  HCT 37.9* 37.8*  PLT 158 157   BMET  Recent Labs  11/07/13 1841 11/07/13 2259 11/08/13 0525  NA 139  --  140  K 3.2*  --  3.4*  CL 98  --  101  CO2  --   --  25  GLUCOSE 167*  --  189*  BUN 10  --  13  CREATININE 1.50* 1.32 1.47*  CALCIUM  --   --  8.7   CMET CMP     Component Value Date/Time   NA 140 11/08/2013 0525   K 3.4* 11/08/2013 0525   CL 101 11/08/2013 0525   CO2 25 11/08/2013 0525   GLUCOSE 189* 11/08/2013 0525   BUN 13 11/08/2013 0525   CREATININE 1.47* 11/08/2013 0525   CALCIUM 8.7 11/08/2013 0525   PROT 6.6 11/08/2013 0525   ALBUMIN 3.0* 11/08/2013 0525   AST 25 11/08/2013 0525   ALT 21 11/08/2013 0525   ALKPHOS 56 11/08/2013 0525   BILITOT 0.8 11/08/2013 0525   GFRNONAA 49* 11/08/2013 0525   GFRAA 56* 11/08/2013 0525    CBG (last 3)   Recent Labs  11/07/13 2207 11/08/13 0748  GLUCAP 159* 174*    INR RESULTS:   No results found for this basename: INR, PROTIME     Studies/Results: Dg Chest 2 View  11/07/2013   CLINICAL DATA:  Fever, hypertension.  EXAM: CHEST  2 VIEW  COMPARISON:   DG CHEST 2 VIEW dated 08/24/2005  FINDINGS: Mild elevation of the left hemidiaphragm. Left lower lobe atelectasis or infiltrate. Right lung is clear. Heart is normal size. No effusions or acute bony abnormality.  IMPRESSION: Mild elevation of the left hemidiaphragm with left lower lobe atelectasis or infiltrate.   Electronically Signed   By: Rolm Baptise M.D.   On: 11/07/2013 21:00    Medications: I have reviewed the patient's current medications.  Assessment/Plan: #1 Fever and Lactic Acidosis: much improved and possibly due to pneumonia, UTI is less likely, no evidence of DVT. Will continue current meds and recheck labs tomorrow, possible discharge on po antibiotics. #2 DM2: stable on SSI #3 HTN: stable   LOS: 1 day   Leanna Battles 11/08/2013, 11:41 AM

## 2013-11-08 NOTE — Progress Notes (Signed)
Patient called RN into room with complaints of chills. RN observed patient's trembling in the bed. Vitals were taken: temp 102.8, b/p 163/91, pulse 120, o2 35 percent. Paging MD on call now.

## 2013-11-08 NOTE — Progress Notes (Signed)
Brief Nutrition Note   Patient identified on Malnutrition Screening Tool  Ht: 5'11" Wt: 227 lbs BMI: 32  Diet: Regular   63 y.o. Male presenting with dysuria over the past 2 days. His admitting diagnosis is lactic acidosis.    Pt has a good appetite and is eating well (100%). Pt has no significant weight loss as his usual weight is 230 lbs and is currently weighing 227 lbs. Pt states he has lost 10-12 lbs since January because of his surgery. EPIC chart reports 3 lb weight loss (1%) since 1/15 which is not clinically significant.   Pt reports that he eats oatmeal for breakfast, sandwich for lunch (rarely), and baked chicken and vegetables for dinner. Pt skips lunch most days. Pt stated he drinks mountain dew mostly. Encouraged patient to drink water more and space meals to 4-5 hours apart for his DM. Pt is on metformin to control his DM.  Physical Exam did not reveal any depletion of muscle mass or body fat.   Wt Readings from Last 10 Encounters:  11/07/13 227 lb 15.3 oz (103.4 kg)  08/06/13 230 lb 2.6 oz (104.4 kg)  08/06/13 230 lb 2.6 oz (104.4 kg)  07/31/13 231 lb 6 oz (104.951 kg)   Labs and medications reviewed.  Low Potassium Elevated Glucose, Cr   No interventions warranted at this time. Please consult if needed.   Carrolyn Leigh, BS Nutrition Intern Pager: [phone]

## 2013-11-08 NOTE — Progress Notes (Signed)
Agree with dietetic intern note. [name] RD, LDN Inpatient Clinical Dietitian Pager: [phone] After Hours Pager: [phone]  

## 2013-11-09 DIAGNOSIS — J189 Pneumonia, unspecified organism: Secondary | ICD-10-CM | POA: Diagnosis present

## 2013-11-09 DIAGNOSIS — E876 Hypokalemia: Secondary | ICD-10-CM | POA: Diagnosis present

## 2013-11-09 LAB — BASIC METABOLIC PANEL
BUN: 10 mg/dL (ref 6–23)
CALCIUM: 8.8 mg/dL (ref 8.4–10.5)
CO2: 21 mEq/L (ref 19–32)
Chloride: 105 mEq/L (ref 96–112)
Creatinine, Ser: 1.13 mg/dL (ref 0.50–1.35)
GFR, EST AFRICAN AMERICAN: 78 mL/min — AB (ref >90)
GFR, EST NON AFRICAN AMERICAN: 67 mL/min — AB (ref >90)
GLUCOSE: 159 mg/dL — AB (ref 70–99)
Potassium: 3.2 mEq/L — ABNORMAL LOW (ref 3.7–5.3)
SODIUM: 138 meq/L (ref 137–147)

## 2013-11-09 LAB — URINE CULTURE
CULTURE: NO GROWTH
Colony Count: 7000
Colony Count: NO GROWTH

## 2013-11-09 LAB — CBC
HCT: 37.4 % — ABNORMAL LOW (ref 39.0–52.0)
Hemoglobin: 13.3 g/dL (ref 13.0–17.0)
MCH: 31.5 pg (ref 26.0–34.0)
MCHC: 35.6 g/dL (ref 30.0–36.0)
MCV: 88.6 fL (ref 78.0–100.0)
PLATELETS: 146 10*3/uL — AB (ref 150–400)
RBC: 4.22 MIL/uL (ref 4.22–5.81)
RDW: 13.3 % (ref 11.5–15.5)
WBC: 8.2 10*3/uL (ref 4.0–10.5)

## 2013-11-09 LAB — GLUCOSE, CAPILLARY: Glucose-Capillary: 160 mg/dL — ABNORMAL HIGH (ref 70–99)

## 2013-11-09 MED ORDER — LEVOFLOXACIN 500 MG PO TABS: 500.0000 mg | ORAL_TABLET | Freq: Every day | ORAL | Status: AC

## 2013-11-09 MED ORDER — IBUPROFEN 200 MG PO TABS: 200.0000 mg | ORAL_TABLET | Freq: Two times a day (BID) | ORAL | Status: AC | PRN

## 2013-11-09 NOTE — Discharge Summary (Signed)
Physician Discharge Summary  Patient ID: Arthur Carr MRN: [id number] DOB/AGE: 1951/03/31 63 y.o.  Admit date: 11/07/2013 Discharge date: 11/09/2013   Discharge Diagnoses:  Principal Problem:   Lactic acidosis Active Problems:   Fever   Bacteriuria   CAP (community acquired pneumonia)   Hypokalemia   Discharged Condition: good  Hospital Course: The patient is a 63 year old African-American man who was in his usual state of fairly good health until earlier today when he began to have dysuria. He was seen by a nurse practitioner at his urologist office with urinalysis suggesting a urinary tract infection, and he was given a prescription for ciprofloxacin. Before getting his prescription he developed significant fever and chills, so he presented to the emergency room for evaluation. He had been having frequency with dysuria over the past 2 days. He has not had productive cough, shortness of breath, abdominal pain, nausea, vomiting, diarrhea, or constipation. He has not had any recent new medications as well.  He was started on Rocephin and Cipro for his pneumonia versus UTI. He developed fevers in the 102 range with chills that rapidly improved. At the time of discharge his urine culture results were pending, and he was not having significant productive cough, dyspnea, abdominal pain, dysuria, or diarrhea. He was eating well and able to walk in the unit independently. He also had a bilateral DVT ultrasound test that was normal.   Consults: None  Significant Diagnostic Studies:  Dg Chest 2 View  11/07/2013   CLINICAL DATA:  Fever, hypertension.  EXAM: CHEST  2 VIEW  COMPARISON:  DG CHEST 2 VIEW dated 08/24/2005  FINDINGS: Mild elevation of the left hemidiaphragm. Left lower lobe atelectasis or infiltrate. Right lung is clear. Heart is normal size. No effusions or acute bony abnormality.  IMPRESSION: Mild elevation of the left hemidiaphragm with left lower lobe atelectasis or infiltrate.    Electronically Signed   By: Rolm Baptise M.D.   On: 11/07/2013 21:00    Labs: Lab Results  Component Value Date   WBC 8.2 11/09/2013   HGB 13.3 11/09/2013   HCT 37.4* 11/09/2013   MCV 88.6 11/09/2013   PLT 146* 11/09/2013     Recent Labs Lab 11/08/13 0525 11/09/13 0635  NA 140 138  K 3.4* 3.2*  CL 101 105  CO2 25 21  BUN 13 10  CREATININE 1.47* 1.13  CALCIUM 8.7 8.8  PROT 6.6  --   BILITOT 0.8  --   ALKPHOS 56  --   ALT 21  --   AST 25  --   GLUCOSE 189* 159*       No results found for this basename: INR, PROTIME     Recent Results (from the past 240 hour(s))  URINE CULTURE     Status: None   Collection Time    11/07/13  7:01 PM      Result Value Ref Range Status   Specimen Description URINE, CLEAN CATCH   Final   Special Requests NONE   Final   Culture  Setup Time     Final   Value: 11/07/2013 19:51     Performed at [facility]     Final   Value: 7,000 COLONIES/ML     Performed at [facility]   Culture     Final   Value: INSIGNIFICANT GROWTH     Performed at [facility]   Report Status 11/09/2013 FINAL   Final  Discharge Exam: Blood pressure 167/85, pulse 84, temperature 99.1 F (37.3 C), temperature source Oral, resp. rate 18, height 5\' 11"  (1.803 m), weight 103.4 kg (227 lb 15.3 oz), SpO2 96.00%.  Physical Exam: In general, he is an overweight African-American man who was in no apparent distress lying at 30 degree elevation of the HOB, HEENT exam was normal, neck was without JVD or bruit, Chest was clear to auscultation, Heart had a regular rate and rhythm without significant murmur, abdomen had normal bowel sounds and no tenderness, He had bilateral 2+ pitting edema of the legs, neurologic exam was nonfocal  Disposition:  He will be discharged to home with a followup visit with his PCP already scheduled.  Discharge Orders   Future Orders Complete By Expires   Call MD for:  As directed    Diet - low sodium  heart healthy  As directed    Discharge instructions  As directed    Increase activity slowly  As directed        Medication List         acetaminophen 500 MG tablet  Commonly known as:  TYLENOL  Take 1,000 mg by mouth every 6 (six) hours as needed for mild pain.     amLODipine 5 MG tablet  Commonly known as:  NORVASC  Take 5 mg by mouth daily.     benazepril 40 MG tablet  Commonly known as:  LOTENSIN  Take 40 mg by mouth daily with breakfast.     furosemide 80 MG tablet  Commonly known as:  LASIX  Take 80 mg by mouth 2 (two) times daily.     glipiZIDE 5 MG tablet  Commonly known as:  GLUCOTROL  Take 5 mg by mouth daily before breakfast.     ibuprofen 200 MG tablet  Commonly known as:  ADVIL,MOTRIN  Take 1 tablet (200 mg total) by mouth 2 (two) times daily as needed for fever.     levofloxacin 500 MG tablet  Commonly known as:  LEVAQUIN  Take 1 tablet (500 mg total) by mouth daily.     metFORMIN 500 MG tablet  Commonly known as:  GLUCOPHAGE  Take 500 mg by mouth 2 (two) times daily with a meal.     pravastatin 20 MG tablet  Commonly known as:  PRAVACHOL  Take 20 mg by mouth daily.           Follow-up Information   Follow up with Marton Redwood, MD. Schedule an appointment as soon as possible for a visit in 1 week.   Specialty:  Internal Medicine   Contact information:   [place] [address] [phone]       Signed: Leanna Battles 11/09/2013, 10:21 AM

## 2013-11-09 NOTE — Progress Notes (Signed)
Pt was given all discharge instruction and they were reviewed. All of the patients questions were answered. IV was removed.

## 2013-11-14 LAB — CULTURE, BLOOD (ROUTINE X 2)
Culture: NO GROWTH
Culture: NO GROWTH

## 2014-04-14 ENCOUNTER — Other Ambulatory Visit: Payer: Self-pay | Admitting: Internal Medicine

## 2014-04-14 DIAGNOSIS — Z0000 Encounter for general adult medical examination without abnormal findings: Secondary | ICD-10-CM

## 2014-04-14 DIAGNOSIS — F17200 Nicotine dependence, unspecified, uncomplicated: Secondary | ICD-10-CM

## 2014-04-23 ENCOUNTER — Ambulatory Visit
Admission: RE | Admit: 2014-04-23 | Discharge: 2014-04-23 | Disposition: A | Discharge status: Home / Self Care | Payer: Commercial Managed Care - HMO | Source: Ambulatory Visit | Attending: Internal Medicine | Admitting: Internal Medicine

## 2014-04-23 DIAGNOSIS — F17200 Nicotine dependence, unspecified, uncomplicated: Secondary | ICD-10-CM

## 2014-04-23 DIAGNOSIS — Z0000 Encounter for general adult medical examination without abnormal findings: Secondary | ICD-10-CM

## 2014-08-12 DIAGNOSIS — I10 Essential (primary) hypertension: Secondary | ICD-10-CM | POA: Diagnosis not present

## 2014-08-12 DIAGNOSIS — F17200 Nicotine dependence, unspecified, uncomplicated: Secondary | ICD-10-CM | POA: Diagnosis not present

## 2014-08-12 DIAGNOSIS — R600 Localized edema: Secondary | ICD-10-CM | POA: Diagnosis not present

## 2014-08-12 DIAGNOSIS — N182 Chronic kidney disease, stage 2 (mild): Secondary | ICD-10-CM | POA: Diagnosis not present

## 2014-08-12 DIAGNOSIS — C61 Malignant neoplasm of prostate: Secondary | ICD-10-CM | POA: Diagnosis not present

## 2014-08-12 DIAGNOSIS — E1149 Type 2 diabetes mellitus with other diabetic neurological complication: Secondary | ICD-10-CM | POA: Diagnosis not present

## 2014-08-12 DIAGNOSIS — E1129 Type 2 diabetes mellitus with other diabetic kidney complication: Secondary | ICD-10-CM | POA: Diagnosis not present

## 2014-08-12 DIAGNOSIS — E785 Hyperlipidemia, unspecified: Secondary | ICD-10-CM | POA: Diagnosis not present

## 2014-09-17 DIAGNOSIS — I10 Essential (primary) hypertension: Secondary | ICD-10-CM | POA: Diagnosis not present

## 2014-09-17 DIAGNOSIS — Z6833 Body mass index (BMI) 33.0-33.9, adult: Secondary | ICD-10-CM | POA: Diagnosis not present

## 2014-09-17 DIAGNOSIS — E1149 Type 2 diabetes mellitus with other diabetic neurological complication: Secondary | ICD-10-CM | POA: Diagnosis not present

## 2014-11-03 DIAGNOSIS — E1129 Type 2 diabetes mellitus with other diabetic kidney complication: Secondary | ICD-10-CM | POA: Diagnosis not present

## 2014-11-03 DIAGNOSIS — I10 Essential (primary) hypertension: Secondary | ICD-10-CM | POA: Diagnosis not present

## 2014-11-03 DIAGNOSIS — Z6833 Body mass index (BMI) 33.0-33.9, adult: Secondary | ICD-10-CM | POA: Diagnosis not present

## 2014-12-16 DIAGNOSIS — N182 Chronic kidney disease, stage 2 (mild): Secondary | ICD-10-CM | POA: Diagnosis not present

## 2014-12-16 DIAGNOSIS — I10 Essential (primary) hypertension: Secondary | ICD-10-CM | POA: Diagnosis not present

## 2014-12-16 DIAGNOSIS — R600 Localized edema: Secondary | ICD-10-CM | POA: Diagnosis not present

## 2014-12-16 DIAGNOSIS — E1149 Type 2 diabetes mellitus with other diabetic neurological complication: Secondary | ICD-10-CM | POA: Diagnosis not present

## 2014-12-16 DIAGNOSIS — E785 Hyperlipidemia, unspecified: Secondary | ICD-10-CM | POA: Diagnosis not present

## 2014-12-16 DIAGNOSIS — F17200 Nicotine dependence, unspecified, uncomplicated: Secondary | ICD-10-CM | POA: Diagnosis not present

## 2014-12-16 DIAGNOSIS — Z1389 Encounter for screening for other disorder: Secondary | ICD-10-CM | POA: Diagnosis not present

## 2014-12-16 DIAGNOSIS — E1129 Type 2 diabetes mellitus with other diabetic kidney complication: Secondary | ICD-10-CM | POA: Diagnosis not present

## 2014-12-24 DIAGNOSIS — N5201 Erectile dysfunction due to arterial insufficiency: Secondary | ICD-10-CM | POA: Diagnosis not present

## 2014-12-24 DIAGNOSIS — N393 Stress incontinence (female) (male): Secondary | ICD-10-CM | POA: Diagnosis not present

## 2014-12-24 DIAGNOSIS — E291 Testicular hypofunction: Secondary | ICD-10-CM | POA: Diagnosis not present

## 2014-12-24 DIAGNOSIS — C61 Malignant neoplasm of prostate: Secondary | ICD-10-CM | POA: Diagnosis not present

## 2015-04-30 DIAGNOSIS — E1149 Type 2 diabetes mellitus with other diabetic neurological complication: Secondary | ICD-10-CM | POA: Diagnosis not present

## 2015-04-30 DIAGNOSIS — Z125 Encounter for screening for malignant neoplasm of prostate: Secondary | ICD-10-CM | POA: Diagnosis not present

## 2015-04-30 DIAGNOSIS — Z0000 Encounter for general adult medical examination without abnormal findings: Secondary | ICD-10-CM | POA: Diagnosis not present

## 2015-05-07 DIAGNOSIS — E1129 Type 2 diabetes mellitus with other diabetic kidney complication: Secondary | ICD-10-CM | POA: Diagnosis not present

## 2015-05-07 DIAGNOSIS — E785 Hyperlipidemia, unspecified: Secondary | ICD-10-CM | POA: Diagnosis not present

## 2015-05-07 DIAGNOSIS — I10 Essential (primary) hypertension: Secondary | ICD-10-CM | POA: Diagnosis not present

## 2015-05-07 DIAGNOSIS — N182 Chronic kidney disease, stage 2 (mild): Secondary | ICD-10-CM | POA: Diagnosis not present

## 2015-05-07 DIAGNOSIS — Z0000 Encounter for general adult medical examination without abnormal findings: Secondary | ICD-10-CM | POA: Diagnosis not present

## 2015-05-07 DIAGNOSIS — C61 Malignant neoplasm of prostate: Secondary | ICD-10-CM | POA: Diagnosis not present

## 2015-05-07 DIAGNOSIS — E1149 Type 2 diabetes mellitus with other diabetic neurological complication: Secondary | ICD-10-CM | POA: Diagnosis not present

## 2015-05-07 DIAGNOSIS — Z1389 Encounter for screening for other disorder: Secondary | ICD-10-CM | POA: Diagnosis not present

## 2015-05-10 DIAGNOSIS — Z1212 Encounter for screening for malignant neoplasm of rectum: Secondary | ICD-10-CM | POA: Diagnosis not present

## 2015-07-05 DIAGNOSIS — C61 Malignant neoplasm of prostate: Secondary | ICD-10-CM | POA: Diagnosis not present

## 2015-07-05 DIAGNOSIS — N5201 Erectile dysfunction due to arterial insufficiency: Secondary | ICD-10-CM | POA: Diagnosis not present

## 2015-07-12 DIAGNOSIS — N5201 Erectile dysfunction due to arterial insufficiency: Secondary | ICD-10-CM | POA: Diagnosis not present

## 2015-07-12 DIAGNOSIS — Z8546 Personal history of malignant neoplasm of prostate: Secondary | ICD-10-CM | POA: Diagnosis not present

## 2015-07-12 DIAGNOSIS — N393 Stress incontinence (female) (male): Secondary | ICD-10-CM | POA: Diagnosis not present

## 2015-07-12 DIAGNOSIS — E291 Testicular hypofunction: Secondary | ICD-10-CM | POA: Diagnosis not present

## 2015-07-29 DIAGNOSIS — H524 Presbyopia: Secondary | ICD-10-CM | POA: Diagnosis not present

## 2015-07-29 DIAGNOSIS — H5210 Myopia, unspecified eye: Secondary | ICD-10-CM | POA: Diagnosis not present

## 2015-09-10 DIAGNOSIS — E1129 Type 2 diabetes mellitus with other diabetic kidney complication: Secondary | ICD-10-CM | POA: Diagnosis not present

## 2015-09-10 DIAGNOSIS — N182 Chronic kidney disease, stage 2 (mild): Secondary | ICD-10-CM | POA: Diagnosis not present

## 2015-09-10 DIAGNOSIS — E1149 Type 2 diabetes mellitus with other diabetic neurological complication: Secondary | ICD-10-CM | POA: Diagnosis not present

## 2015-09-10 DIAGNOSIS — I10 Essential (primary) hypertension: Secondary | ICD-10-CM | POA: Diagnosis not present

## 2015-09-10 DIAGNOSIS — E669 Obesity, unspecified: Secondary | ICD-10-CM | POA: Diagnosis not present

## 2015-09-10 DIAGNOSIS — F17200 Nicotine dependence, unspecified, uncomplicated: Secondary | ICD-10-CM | POA: Diagnosis not present

## 2015-09-10 DIAGNOSIS — E784 Other hyperlipidemia: Secondary | ICD-10-CM | POA: Diagnosis not present

## 2015-09-10 DIAGNOSIS — Z6832 Body mass index (BMI) 32.0-32.9, adult: Secondary | ICD-10-CM | POA: Diagnosis not present

## 2016-01-18 DIAGNOSIS — Z6832 Body mass index (BMI) 32.0-32.9, adult: Secondary | ICD-10-CM | POA: Diagnosis not present

## 2016-01-18 DIAGNOSIS — E784 Other hyperlipidemia: Secondary | ICD-10-CM | POA: Diagnosis not present

## 2016-01-18 DIAGNOSIS — E669 Obesity, unspecified: Secondary | ICD-10-CM | POA: Diagnosis not present

## 2016-01-18 DIAGNOSIS — N182 Chronic kidney disease, stage 2 (mild): Secondary | ICD-10-CM | POA: Diagnosis not present

## 2016-01-18 DIAGNOSIS — G629 Polyneuropathy, unspecified: Secondary | ICD-10-CM | POA: Diagnosis not present

## 2016-01-18 DIAGNOSIS — E1149 Type 2 diabetes mellitus with other diabetic neurological complication: Secondary | ICD-10-CM | POA: Diagnosis not present

## 2016-01-18 DIAGNOSIS — I10 Essential (primary) hypertension: Secondary | ICD-10-CM | POA: Diagnosis not present

## 2016-01-18 DIAGNOSIS — E1129 Type 2 diabetes mellitus with other diabetic kidney complication: Secondary | ICD-10-CM | POA: Diagnosis not present

## 2016-05-05 DIAGNOSIS — Z125 Encounter for screening for malignant neoplasm of prostate: Secondary | ICD-10-CM | POA: Diagnosis not present

## 2016-05-05 DIAGNOSIS — C61 Malignant neoplasm of prostate: Secondary | ICD-10-CM | POA: Diagnosis not present

## 2016-05-05 DIAGNOSIS — E784 Other hyperlipidemia: Secondary | ICD-10-CM | POA: Diagnosis not present

## 2016-05-05 DIAGNOSIS — E1149 Type 2 diabetes mellitus with other diabetic neurological complication: Secondary | ICD-10-CM | POA: Diagnosis not present

## 2016-05-05 DIAGNOSIS — I10 Essential (primary) hypertension: Secondary | ICD-10-CM | POA: Diagnosis not present

## 2016-05-12 DIAGNOSIS — N182 Chronic kidney disease, stage 2 (mild): Secondary | ICD-10-CM | POA: Diagnosis not present

## 2016-05-12 DIAGNOSIS — Z1212 Encounter for screening for malignant neoplasm of rectum: Secondary | ICD-10-CM | POA: Diagnosis not present

## 2016-05-12 DIAGNOSIS — E784 Other hyperlipidemia: Secondary | ICD-10-CM | POA: Diagnosis not present

## 2016-05-12 DIAGNOSIS — F17200 Nicotine dependence, unspecified, uncomplicated: Secondary | ICD-10-CM | POA: Diagnosis not present

## 2016-05-12 DIAGNOSIS — E1129 Type 2 diabetes mellitus with other diabetic kidney complication: Secondary | ICD-10-CM | POA: Diagnosis not present

## 2016-05-12 DIAGNOSIS — Z0000 Encounter for general adult medical examination without abnormal findings: Secondary | ICD-10-CM | POA: Diagnosis not present

## 2016-05-12 DIAGNOSIS — Z8546 Personal history of malignant neoplasm of prostate: Secondary | ICD-10-CM | POA: Diagnosis not present

## 2016-05-12 DIAGNOSIS — R600 Localized edema: Secondary | ICD-10-CM | POA: Diagnosis not present

## 2016-05-12 DIAGNOSIS — I10 Essential (primary) hypertension: Secondary | ICD-10-CM | POA: Diagnosis not present

## 2016-05-12 DIAGNOSIS — E291 Testicular hypofunction: Secondary | ICD-10-CM | POA: Diagnosis not present

## 2016-07-06 DIAGNOSIS — Z8546 Personal history of malignant neoplasm of prostate: Secondary | ICD-10-CM | POA: Diagnosis not present

## 2016-09-11 DIAGNOSIS — E291 Testicular hypofunction: Secondary | ICD-10-CM | POA: Diagnosis not present

## 2016-09-11 DIAGNOSIS — N393 Stress incontinence (female) (male): Secondary | ICD-10-CM | POA: Diagnosis not present

## 2016-09-11 DIAGNOSIS — N5201 Erectile dysfunction due to arterial insufficiency: Secondary | ICD-10-CM | POA: Diagnosis not present

## 2016-09-11 DIAGNOSIS — C61 Malignant neoplasm of prostate: Secondary | ICD-10-CM | POA: Diagnosis not present

## 2016-09-13 DIAGNOSIS — Z8546 Personal history of malignant neoplasm of prostate: Secondary | ICD-10-CM | POA: Diagnosis not present

## 2016-09-13 DIAGNOSIS — G6289 Other specified polyneuropathies: Secondary | ICD-10-CM | POA: Diagnosis not present

## 2016-09-13 DIAGNOSIS — R600 Localized edema: Secondary | ICD-10-CM | POA: Diagnosis not present

## 2016-09-13 DIAGNOSIS — E784 Other hyperlipidemia: Secondary | ICD-10-CM | POA: Diagnosis not present

## 2016-09-13 DIAGNOSIS — E1129 Type 2 diabetes mellitus with other diabetic kidney complication: Secondary | ICD-10-CM | POA: Diagnosis not present

## 2016-09-13 DIAGNOSIS — I10 Essential (primary) hypertension: Secondary | ICD-10-CM | POA: Diagnosis not present

## 2016-09-13 DIAGNOSIS — E668 Other obesity: Secondary | ICD-10-CM | POA: Diagnosis not present

## 2016-09-13 DIAGNOSIS — N182 Chronic kidney disease, stage 2 (mild): Secondary | ICD-10-CM | POA: Diagnosis not present

## 2016-09-13 DIAGNOSIS — E1149 Type 2 diabetes mellitus with other diabetic neurological complication: Secondary | ICD-10-CM | POA: Diagnosis not present

## 2017-01-17 DIAGNOSIS — Z6834 Body mass index (BMI) 34.0-34.9, adult: Secondary | ICD-10-CM | POA: Diagnosis not present

## 2017-01-17 DIAGNOSIS — E784 Other hyperlipidemia: Secondary | ICD-10-CM | POA: Diagnosis not present

## 2017-01-17 DIAGNOSIS — E1129 Type 2 diabetes mellitus with other diabetic kidney complication: Secondary | ICD-10-CM | POA: Diagnosis not present

## 2017-01-17 DIAGNOSIS — Z8546 Personal history of malignant neoplasm of prostate: Secondary | ICD-10-CM | POA: Diagnosis not present

## 2017-01-17 DIAGNOSIS — E668 Other obesity: Secondary | ICD-10-CM | POA: Diagnosis not present

## 2017-01-17 DIAGNOSIS — N182 Chronic kidney disease, stage 2 (mild): Secondary | ICD-10-CM | POA: Diagnosis not present

## 2017-01-17 DIAGNOSIS — I10 Essential (primary) hypertension: Secondary | ICD-10-CM | POA: Diagnosis not present

## 2017-01-17 DIAGNOSIS — E1149 Type 2 diabetes mellitus with other diabetic neurological complication: Secondary | ICD-10-CM | POA: Diagnosis not present

## 2017-03-15 DIAGNOSIS — H524 Presbyopia: Secondary | ICD-10-CM | POA: Diagnosis not present

## 2017-03-15 DIAGNOSIS — E119 Type 2 diabetes mellitus without complications: Secondary | ICD-10-CM | POA: Diagnosis not present

## 2017-03-15 DIAGNOSIS — H2513 Age-related nuclear cataract, bilateral: Secondary | ICD-10-CM | POA: Diagnosis not present

## 2017-05-15 DIAGNOSIS — R82998 Other abnormal findings in urine: Secondary | ICD-10-CM | POA: Diagnosis not present

## 2017-05-15 DIAGNOSIS — Z125 Encounter for screening for malignant neoplasm of prostate: Secondary | ICD-10-CM | POA: Diagnosis not present

## 2017-05-15 DIAGNOSIS — I10 Essential (primary) hypertension: Secondary | ICD-10-CM | POA: Diagnosis not present

## 2017-05-15 DIAGNOSIS — Z0000 Encounter for general adult medical examination without abnormal findings: Secondary | ICD-10-CM | POA: Diagnosis not present

## 2017-05-15 DIAGNOSIS — E1129 Type 2 diabetes mellitus with other diabetic kidney complication: Secondary | ICD-10-CM | POA: Diagnosis not present

## 2017-05-15 DIAGNOSIS — E7849 Other hyperlipidemia: Secondary | ICD-10-CM | POA: Diagnosis not present

## 2017-05-21 DIAGNOSIS — E1129 Type 2 diabetes mellitus with other diabetic kidney complication: Secondary | ICD-10-CM | POA: Diagnosis not present

## 2017-05-21 DIAGNOSIS — N182 Chronic kidney disease, stage 2 (mild): Secondary | ICD-10-CM | POA: Diagnosis not present

## 2017-05-21 DIAGNOSIS — E7849 Other hyperlipidemia: Secondary | ICD-10-CM | POA: Diagnosis not present

## 2017-05-21 DIAGNOSIS — I10 Essential (primary) hypertension: Secondary | ICD-10-CM | POA: Diagnosis not present

## 2017-05-21 DIAGNOSIS — Z0000 Encounter for general adult medical examination without abnormal findings: Secondary | ICD-10-CM | POA: Diagnosis not present

## 2017-05-21 DIAGNOSIS — F17200 Nicotine dependence, unspecified, uncomplicated: Secondary | ICD-10-CM | POA: Diagnosis not present

## 2017-05-21 DIAGNOSIS — E1149 Type 2 diabetes mellitus with other diabetic neurological complication: Secondary | ICD-10-CM | POA: Diagnosis not present

## 2017-05-21 DIAGNOSIS — Z1212 Encounter for screening for malignant neoplasm of rectum: Secondary | ICD-10-CM | POA: Diagnosis not present

## 2017-05-21 DIAGNOSIS — R600 Localized edema: Secondary | ICD-10-CM | POA: Diagnosis not present

## 2017-05-21 DIAGNOSIS — G6289 Other specified polyneuropathies: Secondary | ICD-10-CM | POA: Diagnosis not present

## 2017-09-11 DIAGNOSIS — C61 Malignant neoplasm of prostate: Secondary | ICD-10-CM | POA: Diagnosis not present

## 2017-09-18 DIAGNOSIS — E291 Testicular hypofunction: Secondary | ICD-10-CM | POA: Diagnosis not present

## 2017-09-18 DIAGNOSIS — N5201 Erectile dysfunction due to arterial insufficiency: Secondary | ICD-10-CM | POA: Diagnosis not present

## 2017-09-18 DIAGNOSIS — C61 Malignant neoplasm of prostate: Secondary | ICD-10-CM | POA: Diagnosis not present

## 2017-09-18 DIAGNOSIS — N393 Stress incontinence (female) (male): Secondary | ICD-10-CM | POA: Diagnosis not present

## 2017-09-21 DIAGNOSIS — E1149 Type 2 diabetes mellitus with other diabetic neurological complication: Secondary | ICD-10-CM | POA: Diagnosis not present

## 2017-09-21 DIAGNOSIS — E7849 Other hyperlipidemia: Secondary | ICD-10-CM | POA: Diagnosis not present

## 2017-09-21 DIAGNOSIS — E668 Other obesity: Secondary | ICD-10-CM | POA: Diagnosis not present

## 2017-09-21 DIAGNOSIS — Z8546 Personal history of malignant neoplasm of prostate: Secondary | ICD-10-CM | POA: Diagnosis not present

## 2017-09-21 DIAGNOSIS — N182 Chronic kidney disease, stage 2 (mild): Secondary | ICD-10-CM | POA: Diagnosis not present

## 2017-09-21 DIAGNOSIS — E1129 Type 2 diabetes mellitus with other diabetic kidney complication: Secondary | ICD-10-CM | POA: Diagnosis not present

## 2017-09-21 DIAGNOSIS — I10 Essential (primary) hypertension: Secondary | ICD-10-CM | POA: Diagnosis not present

## 2017-09-21 DIAGNOSIS — Z6833 Body mass index (BMI) 33.0-33.9, adult: Secondary | ICD-10-CM | POA: Diagnosis not present

## 2017-10-11 DIAGNOSIS — M539 Dorsopathy, unspecified: Secondary | ICD-10-CM | POA: Diagnosis not present

## 2017-10-12 DIAGNOSIS — M539 Dorsopathy, unspecified: Secondary | ICD-10-CM | POA: Diagnosis not present

## 2017-10-12 DIAGNOSIS — M1712 Unilateral primary osteoarthritis, left knee: Secondary | ICD-10-CM | POA: Diagnosis not present

## 2017-10-25 DIAGNOSIS — R05 Cough: Secondary | ICD-10-CM | POA: Diagnosis not present

## 2017-10-25 DIAGNOSIS — Z6834 Body mass index (BMI) 34.0-34.9, adult: Secondary | ICD-10-CM | POA: Diagnosis not present

## 2017-10-25 DIAGNOSIS — J019 Acute sinusitis, unspecified: Secondary | ICD-10-CM | POA: Diagnosis not present

## 2017-12-07 DIAGNOSIS — R32 Unspecified urinary incontinence: Secondary | ICD-10-CM | POA: Diagnosis not present

## 2017-12-07 DIAGNOSIS — Z720 Tobacco use: Secondary | ICD-10-CM | POA: Diagnosis not present

## 2017-12-07 DIAGNOSIS — G8929 Other chronic pain: Secondary | ICD-10-CM | POA: Diagnosis not present

## 2017-12-07 DIAGNOSIS — E669 Obesity, unspecified: Secondary | ICD-10-CM | POA: Diagnosis not present

## 2017-12-07 DIAGNOSIS — R609 Edema, unspecified: Secondary | ICD-10-CM | POA: Diagnosis not present

## 2017-12-07 DIAGNOSIS — E119 Type 2 diabetes mellitus without complications: Secondary | ICD-10-CM | POA: Diagnosis not present

## 2017-12-07 DIAGNOSIS — I10 Essential (primary) hypertension: Secondary | ICD-10-CM | POA: Diagnosis not present

## 2017-12-07 DIAGNOSIS — K08409 Partial loss of teeth, unspecified cause, unspecified class: Secondary | ICD-10-CM | POA: Diagnosis not present

## 2017-12-07 DIAGNOSIS — E785 Hyperlipidemia, unspecified: Secondary | ICD-10-CM | POA: Diagnosis not present

## 2017-12-07 DIAGNOSIS — K219 Gastro-esophageal reflux disease without esophagitis: Secondary | ICD-10-CM | POA: Diagnosis not present

## 2018-02-06 DIAGNOSIS — Z1389 Encounter for screening for other disorder: Secondary | ICD-10-CM | POA: Diagnosis not present

## 2018-02-06 DIAGNOSIS — E668 Other obesity: Secondary | ICD-10-CM | POA: Diagnosis not present

## 2018-02-06 DIAGNOSIS — Z6834 Body mass index (BMI) 34.0-34.9, adult: Secondary | ICD-10-CM | POA: Diagnosis not present

## 2018-02-06 DIAGNOSIS — E1149 Type 2 diabetes mellitus with other diabetic neurological complication: Secondary | ICD-10-CM | POA: Diagnosis not present

## 2018-02-06 DIAGNOSIS — E7849 Other hyperlipidemia: Secondary | ICD-10-CM | POA: Diagnosis not present

## 2018-02-06 DIAGNOSIS — I10 Essential (primary) hypertension: Secondary | ICD-10-CM | POA: Diagnosis not present

## 2018-02-06 DIAGNOSIS — N182 Chronic kidney disease, stage 2 (mild): Secondary | ICD-10-CM | POA: Diagnosis not present

## 2018-02-06 DIAGNOSIS — E1129 Type 2 diabetes mellitus with other diabetic kidney complication: Secondary | ICD-10-CM | POA: Diagnosis not present

## 2018-04-05 DIAGNOSIS — E119 Type 2 diabetes mellitus without complications: Secondary | ICD-10-CM | POA: Diagnosis not present

## 2018-04-05 DIAGNOSIS — H524 Presbyopia: Secondary | ICD-10-CM | POA: Diagnosis not present

## 2018-04-05 DIAGNOSIS — H2513 Age-related nuclear cataract, bilateral: Secondary | ICD-10-CM | POA: Diagnosis not present

## 2018-04-05 DIAGNOSIS — Z7984 Long term (current) use of oral hypoglycemic drugs: Secondary | ICD-10-CM | POA: Diagnosis not present

## 2018-04-22 DIAGNOSIS — R69 Illness, unspecified: Secondary | ICD-10-CM | POA: Diagnosis not present

## 2018-06-12 DIAGNOSIS — E7849 Other hyperlipidemia: Secondary | ICD-10-CM | POA: Diagnosis not present

## 2018-06-12 DIAGNOSIS — Z125 Encounter for screening for malignant neoplasm of prostate: Secondary | ICD-10-CM | POA: Diagnosis not present

## 2018-06-12 DIAGNOSIS — E1149 Type 2 diabetes mellitus with other diabetic neurological complication: Secondary | ICD-10-CM | POA: Diagnosis not present

## 2018-06-12 DIAGNOSIS — R82998 Other abnormal findings in urine: Secondary | ICD-10-CM | POA: Diagnosis not present

## 2018-06-12 DIAGNOSIS — I10 Essential (primary) hypertension: Secondary | ICD-10-CM | POA: Diagnosis not present

## 2018-06-19 DIAGNOSIS — Z8546 Personal history of malignant neoplasm of prostate: Secondary | ICD-10-CM | POA: Diagnosis not present

## 2018-06-19 DIAGNOSIS — E1149 Type 2 diabetes mellitus with other diabetic neurological complication: Secondary | ICD-10-CM | POA: Diagnosis not present

## 2018-06-19 DIAGNOSIS — E291 Testicular hypofunction: Secondary | ICD-10-CM | POA: Diagnosis not present

## 2018-06-19 DIAGNOSIS — R600 Localized edema: Secondary | ICD-10-CM | POA: Diagnosis not present

## 2018-06-19 DIAGNOSIS — E7849 Other hyperlipidemia: Secondary | ICD-10-CM | POA: Diagnosis not present

## 2018-06-19 DIAGNOSIS — I10 Essential (primary) hypertension: Secondary | ICD-10-CM | POA: Diagnosis not present

## 2018-06-19 DIAGNOSIS — Z1212 Encounter for screening for malignant neoplasm of rectum: Secondary | ICD-10-CM | POA: Diagnosis not present

## 2018-06-19 DIAGNOSIS — E1129 Type 2 diabetes mellitus with other diabetic kidney complication: Secondary | ICD-10-CM | POA: Diagnosis not present

## 2018-06-19 DIAGNOSIS — Z0000 Encounter for general adult medical examination without abnormal findings: Secondary | ICD-10-CM | POA: Diagnosis not present

## 2018-06-19 DIAGNOSIS — N528 Other male erectile dysfunction: Secondary | ICD-10-CM | POA: Diagnosis not present

## 2018-06-19 DIAGNOSIS — N182 Chronic kidney disease, stage 2 (mild): Secondary | ICD-10-CM | POA: Diagnosis not present

## 2018-09-17 DIAGNOSIS — C61 Malignant neoplasm of prostate: Secondary | ICD-10-CM | POA: Diagnosis not present

## 2018-09-18 DIAGNOSIS — E7849 Other hyperlipidemia: Secondary | ICD-10-CM | POA: Diagnosis not present

## 2018-11-28 DIAGNOSIS — R300 Dysuria: Secondary | ICD-10-CM | POA: Diagnosis not present

## 2018-11-28 DIAGNOSIS — N390 Urinary tract infection, site not specified: Secondary | ICD-10-CM | POA: Diagnosis not present

## 2018-12-18 DIAGNOSIS — E1149 Type 2 diabetes mellitus with other diabetic neurological complication: Secondary | ICD-10-CM | POA: Diagnosis not present

## 2018-12-18 DIAGNOSIS — N182 Chronic kidney disease, stage 2 (mild): Secondary | ICD-10-CM | POA: Diagnosis not present

## 2018-12-18 DIAGNOSIS — Z8546 Personal history of malignant neoplasm of prostate: Secondary | ICD-10-CM | POA: Diagnosis not present

## 2018-12-18 DIAGNOSIS — I10 Essential (primary) hypertension: Secondary | ICD-10-CM | POA: Diagnosis not present

## 2018-12-18 DIAGNOSIS — E1129 Type 2 diabetes mellitus with other diabetic kidney complication: Secondary | ICD-10-CM | POA: Diagnosis not present

## 2018-12-18 DIAGNOSIS — E785 Hyperlipidemia, unspecified: Secondary | ICD-10-CM | POA: Diagnosis not present

## 2019-04-07 DIAGNOSIS — H524 Presbyopia: Secondary | ICD-10-CM | POA: Diagnosis not present

## 2019-04-07 DIAGNOSIS — E119 Type 2 diabetes mellitus without complications: Secondary | ICD-10-CM | POA: Diagnosis not present

## 2019-04-07 DIAGNOSIS — H2513 Age-related nuclear cataract, bilateral: Secondary | ICD-10-CM | POA: Diagnosis not present

## 2019-04-25 DIAGNOSIS — R69 Illness, unspecified: Secondary | ICD-10-CM | POA: Diagnosis not present

## 2019-06-20 DIAGNOSIS — E1149 Type 2 diabetes mellitus with other diabetic neurological complication: Secondary | ICD-10-CM | POA: Diagnosis not present

## 2019-06-20 DIAGNOSIS — Z0000 Encounter for general adult medical examination without abnormal findings: Secondary | ICD-10-CM | POA: Diagnosis not present

## 2019-06-20 DIAGNOSIS — N529 Male erectile dysfunction, unspecified: Secondary | ICD-10-CM | POA: Diagnosis not present

## 2019-06-20 DIAGNOSIS — Z125 Encounter for screening for malignant neoplasm of prostate: Secondary | ICD-10-CM | POA: Diagnosis not present

## 2019-06-20 DIAGNOSIS — E7849 Other hyperlipidemia: Secondary | ICD-10-CM | POA: Diagnosis not present

## 2019-06-25 DIAGNOSIS — E669 Obesity, unspecified: Secondary | ICD-10-CM | POA: Diagnosis not present

## 2019-06-25 DIAGNOSIS — N182 Chronic kidney disease, stage 2 (mild): Secondary | ICD-10-CM | POA: Diagnosis not present

## 2019-06-25 DIAGNOSIS — E785 Hyperlipidemia, unspecified: Secondary | ICD-10-CM | POA: Diagnosis not present

## 2019-06-25 DIAGNOSIS — I10 Essential (primary) hypertension: Secondary | ICD-10-CM | POA: Diagnosis not present

## 2019-06-25 DIAGNOSIS — E1129 Type 2 diabetes mellitus with other diabetic kidney complication: Secondary | ICD-10-CM | POA: Diagnosis not present

## 2019-06-25 DIAGNOSIS — R69 Illness, unspecified: Secondary | ICD-10-CM | POA: Diagnosis not present

## 2019-06-25 DIAGNOSIS — Z0000 Encounter for general adult medical examination without abnormal findings: Secondary | ICD-10-CM | POA: Diagnosis not present

## 2019-06-25 DIAGNOSIS — N529 Male erectile dysfunction, unspecified: Secondary | ICD-10-CM | POA: Diagnosis not present

## 2019-06-25 DIAGNOSIS — Z8546 Personal history of malignant neoplasm of prostate: Secondary | ICD-10-CM | POA: Diagnosis not present

## 2019-06-25 DIAGNOSIS — E1149 Type 2 diabetes mellitus with other diabetic neurological complication: Secondary | ICD-10-CM | POA: Diagnosis not present

## 2019-06-27 DIAGNOSIS — R82998 Other abnormal findings in urine: Secondary | ICD-10-CM | POA: Diagnosis not present

## 2019-06-27 DIAGNOSIS — I10 Essential (primary) hypertension: Secondary | ICD-10-CM | POA: Diagnosis not present

## 2019-09-01 DIAGNOSIS — R609 Edema, unspecified: Secondary | ICD-10-CM | POA: Diagnosis not present

## 2019-09-01 DIAGNOSIS — Z6834 Body mass index (BMI) 34.0-34.9, adult: Secondary | ICD-10-CM | POA: Diagnosis not present

## 2019-09-01 DIAGNOSIS — Z8249 Family history of ischemic heart disease and other diseases of the circulatory system: Secondary | ICD-10-CM | POA: Diagnosis not present

## 2019-09-01 DIAGNOSIS — Z7984 Long term (current) use of oral hypoglycemic drugs: Secondary | ICD-10-CM | POA: Diagnosis not present

## 2019-09-01 DIAGNOSIS — I10 Essential (primary) hypertension: Secondary | ICD-10-CM | POA: Diagnosis not present

## 2019-09-01 DIAGNOSIS — E669 Obesity, unspecified: Secondary | ICD-10-CM | POA: Diagnosis not present

## 2019-09-01 DIAGNOSIS — R32 Unspecified urinary incontinence: Secondary | ICD-10-CM | POA: Diagnosis not present

## 2019-09-01 DIAGNOSIS — E119 Type 2 diabetes mellitus without complications: Secondary | ICD-10-CM | POA: Diagnosis not present

## 2019-09-01 DIAGNOSIS — Z720 Tobacco use: Secondary | ICD-10-CM | POA: Diagnosis not present

## 2019-09-01 DIAGNOSIS — E785 Hyperlipidemia, unspecified: Secondary | ICD-10-CM | POA: Diagnosis not present

## 2019-10-30 DIAGNOSIS — Z23 Encounter for immunization: Secondary | ICD-10-CM | POA: Diagnosis not present

## 2019-10-30 DIAGNOSIS — E785 Hyperlipidemia, unspecified: Secondary | ICD-10-CM | POA: Diagnosis not present

## 2019-10-30 DIAGNOSIS — N182 Chronic kidney disease, stage 2 (mild): Secondary | ICD-10-CM | POA: Diagnosis not present

## 2019-10-30 DIAGNOSIS — E669 Obesity, unspecified: Secondary | ICD-10-CM | POA: Diagnosis not present

## 2019-10-30 DIAGNOSIS — E1129 Type 2 diabetes mellitus with other diabetic kidney complication: Secondary | ICD-10-CM | POA: Diagnosis not present

## 2019-10-30 DIAGNOSIS — E1149 Type 2 diabetes mellitus with other diabetic neurological complication: Secondary | ICD-10-CM | POA: Diagnosis not present

## 2019-10-30 DIAGNOSIS — I129 Hypertensive chronic kidney disease with stage 1 through stage 4 chronic kidney disease, or unspecified chronic kidney disease: Secondary | ICD-10-CM | POA: Diagnosis not present

## 2020-03-01 DIAGNOSIS — E669 Obesity, unspecified: Secondary | ICD-10-CM | POA: Diagnosis not present

## 2020-03-01 DIAGNOSIS — I10 Essential (primary) hypertension: Secondary | ICD-10-CM | POA: Diagnosis not present

## 2020-03-01 DIAGNOSIS — N182 Chronic kidney disease, stage 2 (mild): Secondary | ICD-10-CM | POA: Diagnosis not present

## 2020-03-01 DIAGNOSIS — E1129 Type 2 diabetes mellitus with other diabetic kidney complication: Secondary | ICD-10-CM | POA: Diagnosis not present

## 2020-03-01 DIAGNOSIS — E1149 Type 2 diabetes mellitus with other diabetic neurological complication: Secondary | ICD-10-CM | POA: Diagnosis not present

## 2020-03-01 DIAGNOSIS — E785 Hyperlipidemia, unspecified: Secondary | ICD-10-CM | POA: Diagnosis not present

## 2020-04-20 DIAGNOSIS — H524 Presbyopia: Secondary | ICD-10-CM | POA: Diagnosis not present

## 2020-04-20 DIAGNOSIS — E119 Type 2 diabetes mellitus without complications: Secondary | ICD-10-CM | POA: Diagnosis not present

## 2020-04-20 DIAGNOSIS — H5202 Hypermetropia, left eye: Secondary | ICD-10-CM | POA: Diagnosis not present

## 2020-04-20 DIAGNOSIS — H2513 Age-related nuclear cataract, bilateral: Secondary | ICD-10-CM | POA: Diagnosis not present

## 2020-04-20 DIAGNOSIS — H52203 Unspecified astigmatism, bilateral: Secondary | ICD-10-CM | POA: Diagnosis not present

## 2020-04-20 DIAGNOSIS — Z7984 Long term (current) use of oral hypoglycemic drugs: Secondary | ICD-10-CM | POA: Diagnosis not present

## 2020-05-10 DIAGNOSIS — R69 Illness, unspecified: Secondary | ICD-10-CM | POA: Diagnosis not present

## 2020-05-27 DIAGNOSIS — Z1211 Encounter for screening for malignant neoplasm of colon: Secondary | ICD-10-CM | POA: Diagnosis not present

## 2020-08-03 DIAGNOSIS — R197 Diarrhea, unspecified: Secondary | ICD-10-CM | POA: Diagnosis not present

## 2020-08-03 DIAGNOSIS — R195 Other fecal abnormalities: Secondary | ICD-10-CM | POA: Diagnosis not present

## 2020-08-25 DIAGNOSIS — E785 Hyperlipidemia, unspecified: Secondary | ICD-10-CM | POA: Diagnosis not present

## 2020-08-25 DIAGNOSIS — Z125 Encounter for screening for malignant neoplasm of prostate: Secondary | ICD-10-CM | POA: Diagnosis not present

## 2020-08-25 DIAGNOSIS — E1129 Type 2 diabetes mellitus with other diabetic kidney complication: Secondary | ICD-10-CM | POA: Diagnosis not present

## 2020-08-25 DIAGNOSIS — E291 Testicular hypofunction: Secondary | ICD-10-CM | POA: Diagnosis not present

## 2020-09-01 DIAGNOSIS — G629 Polyneuropathy, unspecified: Secondary | ICD-10-CM | POA: Diagnosis not present

## 2020-09-01 DIAGNOSIS — Z1331 Encounter for screening for depression: Secondary | ICD-10-CM | POA: Diagnosis not present

## 2020-09-01 DIAGNOSIS — R82998 Other abnormal findings in urine: Secondary | ICD-10-CM | POA: Diagnosis not present

## 2020-09-01 DIAGNOSIS — E1129 Type 2 diabetes mellitus with other diabetic kidney complication: Secondary | ICD-10-CM | POA: Diagnosis not present

## 2020-09-01 DIAGNOSIS — N182 Chronic kidney disease, stage 2 (mild): Secondary | ICD-10-CM | POA: Diagnosis not present

## 2020-09-01 DIAGNOSIS — I10 Essential (primary) hypertension: Secondary | ICD-10-CM | POA: Diagnosis not present

## 2020-09-01 DIAGNOSIS — Z8546 Personal history of malignant neoplasm of prostate: Secondary | ICD-10-CM | POA: Diagnosis not present

## 2020-09-01 DIAGNOSIS — E785 Hyperlipidemia, unspecified: Secondary | ICD-10-CM | POA: Diagnosis not present

## 2020-09-01 DIAGNOSIS — R69 Illness, unspecified: Secondary | ICD-10-CM | POA: Diagnosis not present

## 2020-09-01 DIAGNOSIS — M1990 Unspecified osteoarthritis, unspecified site: Secondary | ICD-10-CM | POA: Diagnosis not present

## 2020-09-01 DIAGNOSIS — Z0000 Encounter for general adult medical examination without abnormal findings: Secondary | ICD-10-CM | POA: Diagnosis not present

## 2020-09-01 DIAGNOSIS — Z1339 Encounter for screening examination for other mental health and behavioral disorders: Secondary | ICD-10-CM | POA: Diagnosis not present

## 2020-09-01 DIAGNOSIS — E1149 Type 2 diabetes mellitus with other diabetic neurological complication: Secondary | ICD-10-CM | POA: Diagnosis not present

## 2020-09-02 DIAGNOSIS — Z01812 Encounter for preprocedural laboratory examination: Secondary | ICD-10-CM | POA: Diagnosis not present

## 2020-09-03 ENCOUNTER — Other Ambulatory Visit: Payer: Self-pay | Admitting: Internal Medicine

## 2020-09-03 DIAGNOSIS — F17200 Nicotine dependence, unspecified, uncomplicated: Secondary | ICD-10-CM

## 2020-09-07 DIAGNOSIS — K640 First degree hemorrhoids: Secondary | ICD-10-CM | POA: Diagnosis not present

## 2020-09-07 DIAGNOSIS — K635 Polyp of colon: Secondary | ICD-10-CM | POA: Diagnosis not present

## 2020-09-07 DIAGNOSIS — R195 Other fecal abnormalities: Secondary | ICD-10-CM | POA: Diagnosis not present

## 2020-09-07 DIAGNOSIS — D120 Benign neoplasm of cecum: Secondary | ICD-10-CM | POA: Diagnosis not present

## 2020-09-07 DIAGNOSIS — K5730 Diverticulosis of large intestine without perforation or abscess without bleeding: Secondary | ICD-10-CM | POA: Diagnosis not present

## 2020-09-10 DIAGNOSIS — K635 Polyp of colon: Secondary | ICD-10-CM | POA: Diagnosis not present

## 2020-09-10 DIAGNOSIS — D120 Benign neoplasm of cecum: Secondary | ICD-10-CM | POA: Diagnosis not present

## 2020-09-24 ENCOUNTER — Ambulatory Visit
Admission: RE | Admit: 2020-09-24 | Discharge: 2020-09-24 | Disposition: A | Discharge status: Home / Self Care | Payer: Commercial Managed Care - HMO | Source: Ambulatory Visit | Attending: Internal Medicine | Admitting: Internal Medicine

## 2020-09-24 DIAGNOSIS — F17200 Nicotine dependence, unspecified, uncomplicated: Secondary | ICD-10-CM

## 2020-12-30 ENCOUNTER — Other Ambulatory Visit (HOSPITAL_COMMUNITY): Payer: Self-pay | Admitting: Internal Medicine

## 2020-12-30 DIAGNOSIS — R600 Localized edema: Secondary | ICD-10-CM

## 2020-12-30 DIAGNOSIS — E785 Hyperlipidemia, unspecified: Secondary | ICD-10-CM | POA: Diagnosis not present

## 2020-12-30 DIAGNOSIS — G629 Polyneuropathy, unspecified: Secondary | ICD-10-CM | POA: Diagnosis not present

## 2020-12-30 DIAGNOSIS — N182 Chronic kidney disease, stage 2 (mild): Secondary | ICD-10-CM | POA: Diagnosis not present

## 2020-12-30 DIAGNOSIS — M1990 Unspecified osteoarthritis, unspecified site: Secondary | ICD-10-CM | POA: Diagnosis not present

## 2020-12-30 DIAGNOSIS — R69 Illness, unspecified: Secondary | ICD-10-CM | POA: Diagnosis not present

## 2020-12-30 DIAGNOSIS — E1129 Type 2 diabetes mellitus with other diabetic kidney complication: Secondary | ICD-10-CM | POA: Diagnosis not present

## 2020-12-30 DIAGNOSIS — E669 Obesity, unspecified: Secondary | ICD-10-CM | POA: Diagnosis not present

## 2020-12-30 DIAGNOSIS — I129 Hypertensive chronic kidney disease with stage 1 through stage 4 chronic kidney disease, or unspecified chronic kidney disease: Secondary | ICD-10-CM | POA: Diagnosis not present

## 2020-12-30 DIAGNOSIS — E1149 Type 2 diabetes mellitus with other diabetic neurological complication: Secondary | ICD-10-CM | POA: Diagnosis not present

## 2020-12-30 DIAGNOSIS — G5602 Carpal tunnel syndrome, left upper limb: Secondary | ICD-10-CM | POA: Diagnosis not present

## 2020-12-31 ENCOUNTER — Ambulatory Visit (HOSPITAL_COMMUNITY)
Admission: RE | Admit: 2020-12-31 | Discharge: 2020-12-31 | Disposition: A | Discharge status: Home / Self Care | Payer: Medicare HMO | Source: Ambulatory Visit | Attending: Internal Medicine | Admitting: Internal Medicine

## 2020-12-31 ENCOUNTER — Other Ambulatory Visit: Payer: Self-pay

## 2020-12-31 DIAGNOSIS — R600 Localized edema: Secondary | ICD-10-CM | POA: Diagnosis not present

## 2021-01-19 DIAGNOSIS — N481 Balanitis: Secondary | ICD-10-CM | POA: Diagnosis not present

## 2021-01-19 DIAGNOSIS — B3749 Other urogenital candidiasis: Secondary | ICD-10-CM | POA: Diagnosis not present

## 2021-01-19 DIAGNOSIS — E1129 Type 2 diabetes mellitus with other diabetic kidney complication: Secondary | ICD-10-CM | POA: Diagnosis not present

## 2021-04-21 DIAGNOSIS — H52203 Unspecified astigmatism, bilateral: Secondary | ICD-10-CM | POA: Diagnosis not present

## 2021-04-21 DIAGNOSIS — E1136 Type 2 diabetes mellitus with diabetic cataract: Secondary | ICD-10-CM | POA: Diagnosis not present

## 2021-04-21 DIAGNOSIS — H2513 Age-related nuclear cataract, bilateral: Secondary | ICD-10-CM | POA: Diagnosis not present

## 2021-04-21 DIAGNOSIS — H524 Presbyopia: Secondary | ICD-10-CM | POA: Diagnosis not present

## 2021-04-30 DIAGNOSIS — E669 Obesity, unspecified: Secondary | ICD-10-CM | POA: Diagnosis not present

## 2021-04-30 DIAGNOSIS — Z823 Family history of stroke: Secondary | ICD-10-CM | POA: Diagnosis not present

## 2021-04-30 DIAGNOSIS — E785 Hyperlipidemia, unspecified: Secondary | ICD-10-CM | POA: Diagnosis not present

## 2021-04-30 DIAGNOSIS — Z8249 Family history of ischemic heart disease and other diseases of the circulatory system: Secondary | ICD-10-CM | POA: Diagnosis not present

## 2021-04-30 DIAGNOSIS — R609 Edema, unspecified: Secondary | ICD-10-CM | POA: Diagnosis not present

## 2021-04-30 DIAGNOSIS — Z833 Family history of diabetes mellitus: Secondary | ICD-10-CM | POA: Diagnosis not present

## 2021-04-30 DIAGNOSIS — N189 Chronic kidney disease, unspecified: Secondary | ICD-10-CM | POA: Diagnosis not present

## 2021-04-30 DIAGNOSIS — Z7984 Long term (current) use of oral hypoglycemic drugs: Secondary | ICD-10-CM | POA: Diagnosis not present

## 2021-04-30 DIAGNOSIS — I129 Hypertensive chronic kidney disease with stage 1 through stage 4 chronic kidney disease, or unspecified chronic kidney disease: Secondary | ICD-10-CM | POA: Diagnosis not present

## 2021-04-30 DIAGNOSIS — E1122 Type 2 diabetes mellitus with diabetic chronic kidney disease: Secondary | ICD-10-CM | POA: Diagnosis not present

## 2021-04-30 DIAGNOSIS — Z6833 Body mass index (BMI) 33.0-33.9, adult: Secondary | ICD-10-CM | POA: Diagnosis not present

## 2021-04-30 DIAGNOSIS — Z809 Family history of malignant neoplasm, unspecified: Secondary | ICD-10-CM | POA: Diagnosis not present

## 2021-05-03 DIAGNOSIS — N529 Male erectile dysfunction, unspecified: Secondary | ICD-10-CM | POA: Diagnosis not present

## 2021-05-03 DIAGNOSIS — E1129 Type 2 diabetes mellitus with other diabetic kidney complication: Secondary | ICD-10-CM | POA: Diagnosis not present

## 2021-05-03 DIAGNOSIS — E291 Testicular hypofunction: Secondary | ICD-10-CM | POA: Diagnosis not present

## 2021-05-03 DIAGNOSIS — E669 Obesity, unspecified: Secondary | ICD-10-CM | POA: Diagnosis not present

## 2021-05-03 DIAGNOSIS — G629 Polyneuropathy, unspecified: Secondary | ICD-10-CM | POA: Diagnosis not present

## 2021-05-03 DIAGNOSIS — I10 Essential (primary) hypertension: Secondary | ICD-10-CM | POA: Diagnosis not present

## 2021-05-03 DIAGNOSIS — Z8546 Personal history of malignant neoplasm of prostate: Secondary | ICD-10-CM | POA: Diagnosis not present

## 2021-05-03 DIAGNOSIS — N182 Chronic kidney disease, stage 2 (mild): Secondary | ICD-10-CM | POA: Diagnosis not present

## 2021-05-03 DIAGNOSIS — E785 Hyperlipidemia, unspecified: Secondary | ICD-10-CM | POA: Diagnosis not present

## 2021-05-03 DIAGNOSIS — E1149 Type 2 diabetes mellitus with other diabetic neurological complication: Secondary | ICD-10-CM | POA: Diagnosis not present

## 2021-05-14 DIAGNOSIS — Z23 Encounter for immunization: Secondary | ICD-10-CM | POA: Diagnosis not present

## 2021-09-26 DIAGNOSIS — E785 Hyperlipidemia, unspecified: Secondary | ICD-10-CM | POA: Diagnosis not present

## 2021-09-26 DIAGNOSIS — E291 Testicular hypofunction: Secondary | ICD-10-CM | POA: Diagnosis not present

## 2021-09-26 DIAGNOSIS — E1129 Type 2 diabetes mellitus with other diabetic kidney complication: Secondary | ICD-10-CM | POA: Diagnosis not present

## 2021-09-26 DIAGNOSIS — Z125 Encounter for screening for malignant neoplasm of prostate: Secondary | ICD-10-CM | POA: Diagnosis not present

## 2021-09-26 DIAGNOSIS — I10 Essential (primary) hypertension: Secondary | ICD-10-CM | POA: Diagnosis not present

## 2021-10-03 DIAGNOSIS — Z0000 Encounter for general adult medical examination without abnormal findings: Secondary | ICD-10-CM | POA: Diagnosis not present

## 2021-10-03 DIAGNOSIS — R82998 Other abnormal findings in urine: Secondary | ICD-10-CM | POA: Diagnosis not present

## 2021-10-03 DIAGNOSIS — E1129 Type 2 diabetes mellitus with other diabetic kidney complication: Secondary | ICD-10-CM | POA: Diagnosis not present

## 2021-10-03 DIAGNOSIS — N182 Chronic kidney disease, stage 2 (mild): Secondary | ICD-10-CM | POA: Diagnosis not present

## 2021-10-03 DIAGNOSIS — E669 Obesity, unspecified: Secondary | ICD-10-CM | POA: Diagnosis not present

## 2021-10-03 DIAGNOSIS — R69 Illness, unspecified: Secondary | ICD-10-CM | POA: Diagnosis not present

## 2021-10-03 DIAGNOSIS — E785 Hyperlipidemia, unspecified: Secondary | ICD-10-CM | POA: Diagnosis not present

## 2021-10-03 DIAGNOSIS — I129 Hypertensive chronic kidney disease with stage 1 through stage 4 chronic kidney disease, or unspecified chronic kidney disease: Secondary | ICD-10-CM | POA: Diagnosis not present

## 2021-10-03 DIAGNOSIS — R600 Localized edema: Secondary | ICD-10-CM | POA: Diagnosis not present

## 2021-10-03 DIAGNOSIS — Z1339 Encounter for screening examination for other mental health and behavioral disorders: Secondary | ICD-10-CM | POA: Diagnosis not present

## 2021-10-03 DIAGNOSIS — Z1331 Encounter for screening for depression: Secondary | ICD-10-CM | POA: Diagnosis not present

## 2021-10-03 DIAGNOSIS — Z8546 Personal history of malignant neoplasm of prostate: Secondary | ICD-10-CM | POA: Diagnosis not present

## 2021-10-03 DIAGNOSIS — E291 Testicular hypofunction: Secondary | ICD-10-CM | POA: Diagnosis not present

## 2021-11-07 DIAGNOSIS — K648 Other hemorrhoids: Secondary | ICD-10-CM | POA: Diagnosis not present

## 2021-11-07 DIAGNOSIS — Z86010 Personal history of colonic polyps: Secondary | ICD-10-CM | POA: Diagnosis not present

## 2021-11-07 DIAGNOSIS — K5730 Diverticulosis of large intestine without perforation or abscess without bleeding: Secondary | ICD-10-CM | POA: Diagnosis not present

## 2021-12-01 DIAGNOSIS — E785 Hyperlipidemia, unspecified: Secondary | ICD-10-CM | POA: Diagnosis not present

## 2021-12-01 DIAGNOSIS — E669 Obesity, unspecified: Secondary | ICD-10-CM | POA: Diagnosis not present

## 2021-12-01 DIAGNOSIS — E1129 Type 2 diabetes mellitus with other diabetic kidney complication: Secondary | ICD-10-CM | POA: Diagnosis not present

## 2021-12-01 DIAGNOSIS — I10 Essential (primary) hypertension: Secondary | ICD-10-CM | POA: Diagnosis not present

## 2021-12-01 DIAGNOSIS — N182 Chronic kidney disease, stage 2 (mild): Secondary | ICD-10-CM | POA: Diagnosis not present

## 2022-01-06 ENCOUNTER — Other Ambulatory Visit: Payer: Self-pay

## 2022-01-06 ENCOUNTER — Emergency Department (HOSPITAL_COMMUNITY): Payer: Medicare HMO

## 2022-01-06 ENCOUNTER — Encounter (HOSPITAL_COMMUNITY): Payer: Self-pay | Admitting: Pharmacy Technician

## 2022-01-06 ENCOUNTER — Inpatient Hospital Stay (HOSPITAL_COMMUNITY)
Admission: EM | Admit: 2022-01-06 | Discharge: 2022-01-10 | DRG: 242 | Disposition: A | Discharge status: Home / Self Care | Payer: Medicare HMO | Attending: Internal Medicine | Admitting: Internal Medicine

## 2022-01-06 ENCOUNTER — Observation Stay (HOSPITAL_COMMUNITY): Payer: Medicare HMO

## 2022-01-06 DIAGNOSIS — E119 Type 2 diabetes mellitus without complications: Secondary | ICD-10-CM | POA: Diagnosis not present

## 2022-01-06 DIAGNOSIS — Z8546 Personal history of malignant neoplasm of prostate: Secondary | ICD-10-CM | POA: Diagnosis not present

## 2022-01-06 DIAGNOSIS — F17210 Nicotine dependence, cigarettes, uncomplicated: Secondary | ICD-10-CM | POA: Diagnosis present

## 2022-01-06 DIAGNOSIS — I5031 Acute diastolic (congestive) heart failure: Secondary | ICD-10-CM | POA: Diagnosis not present

## 2022-01-06 DIAGNOSIS — Z950 Presence of cardiac pacemaker: Secondary | ICD-10-CM | POA: Diagnosis not present

## 2022-01-06 DIAGNOSIS — Z96641 Presence of right artificial hip joint: Secondary | ICD-10-CM | POA: Diagnosis present

## 2022-01-06 DIAGNOSIS — I129 Hypertensive chronic kidney disease with stage 1 through stage 4 chronic kidney disease, or unspecified chronic kidney disease: Secondary | ICD-10-CM | POA: Diagnosis not present

## 2022-01-06 DIAGNOSIS — J811 Chronic pulmonary edema: Secondary | ICD-10-CM | POA: Diagnosis not present

## 2022-01-06 DIAGNOSIS — E1129 Type 2 diabetes mellitus with other diabetic kidney complication: Secondary | ICD-10-CM | POA: Diagnosis not present

## 2022-01-06 DIAGNOSIS — Z7984 Long term (current) use of oral hypoglycemic drugs: Secondary | ICD-10-CM | POA: Diagnosis not present

## 2022-01-06 DIAGNOSIS — I130 Hypertensive heart and chronic kidney disease with heart failure and stage 1 through stage 4 chronic kidney disease, or unspecified chronic kidney disease: Secondary | ICD-10-CM | POA: Diagnosis present

## 2022-01-06 DIAGNOSIS — R69 Illness, unspecified: Secondary | ICD-10-CM | POA: Diagnosis not present

## 2022-01-06 DIAGNOSIS — E669 Obesity, unspecified: Secondary | ICD-10-CM | POA: Diagnosis not present

## 2022-01-06 DIAGNOSIS — N182 Chronic kidney disease, stage 2 (mild): Secondary | ICD-10-CM | POA: Diagnosis not present

## 2022-01-06 DIAGNOSIS — Z9079 Acquired absence of other genital organ(s): Secondary | ICD-10-CM | POA: Diagnosis not present

## 2022-01-06 DIAGNOSIS — I442 Atrioventricular block, complete: Secondary | ICD-10-CM

## 2022-01-06 DIAGNOSIS — I441 Atrioventricular block, second degree: Secondary | ICD-10-CM | POA: Diagnosis present

## 2022-01-06 DIAGNOSIS — R5383 Other fatigue: Secondary | ICD-10-CM | POA: Diagnosis not present

## 2022-01-06 DIAGNOSIS — E1122 Type 2 diabetes mellitus with diabetic chronic kidney disease: Secondary | ICD-10-CM | POA: Diagnosis not present

## 2022-01-06 DIAGNOSIS — Z743 Need for continuous supervision: Secondary | ICD-10-CM | POA: Diagnosis not present

## 2022-01-06 DIAGNOSIS — R0600 Dyspnea, unspecified: Secondary | ICD-10-CM | POA: Diagnosis not present

## 2022-01-06 DIAGNOSIS — E785 Hyperlipidemia, unspecified: Secondary | ICD-10-CM | POA: Diagnosis not present

## 2022-01-06 DIAGNOSIS — I10 Essential (primary) hypertension: Secondary | ICD-10-CM

## 2022-01-06 DIAGNOSIS — Z79899 Other long term (current) drug therapy: Secondary | ICD-10-CM | POA: Diagnosis not present

## 2022-01-06 DIAGNOSIS — E876 Hypokalemia: Secondary | ICD-10-CM | POA: Diagnosis not present

## 2022-01-06 DIAGNOSIS — R079 Chest pain, unspecified: Secondary | ICD-10-CM | POA: Diagnosis not present

## 2022-01-06 DIAGNOSIS — I161 Hypertensive emergency: Secondary | ICD-10-CM | POA: Diagnosis present

## 2022-01-06 DIAGNOSIS — N179 Acute kidney failure, unspecified: Secondary | ICD-10-CM | POA: Diagnosis not present

## 2022-01-06 DIAGNOSIS — Z7982 Long term (current) use of aspirin: Secondary | ICD-10-CM

## 2022-01-06 DIAGNOSIS — R001 Bradycardia, unspecified: Secondary | ICD-10-CM | POA: Diagnosis not present

## 2022-01-06 DIAGNOSIS — N189 Chronic kidney disease, unspecified: Secondary | ICD-10-CM | POA: Diagnosis not present

## 2022-01-06 DIAGNOSIS — R0602 Shortness of breath: Secondary | ICD-10-CM | POA: Diagnosis not present

## 2022-01-06 DIAGNOSIS — R0789 Other chest pain: Secondary | ICD-10-CM | POA: Diagnosis not present

## 2022-01-06 LAB — TSH: TSH: 0.978 u[IU]/mL (ref 0.350–4.500)

## 2022-01-06 LAB — BASIC METABOLIC PANEL
Anion gap: 12 (ref 5–15)
BUN: 20 mg/dL (ref 8–23)
CO2: 20 mmol/L — ABNORMAL LOW (ref 22–32)
Calcium: 8.5 mg/dL — ABNORMAL LOW (ref 8.9–10.3)
Chloride: 106 mmol/L (ref 98–111)
Creatinine, Ser: 1.9 mg/dL — ABNORMAL HIGH (ref 0.61–1.24)
GFR, Estimated: 37 mL/min — ABNORMAL LOW (ref >60)
Glucose, Bld: 207 mg/dL — ABNORMAL HIGH (ref 70–99)
Potassium: 3.7 mmol/L (ref 3.5–5.1)
Sodium: 138 mmol/L (ref 135–145)

## 2022-01-06 LAB — CBC WITH DIFFERENTIAL/PLATELET
Abs Immature Granulocytes: 0.02 10*3/uL (ref 0.00–0.07)
Basophils Absolute: 0 10*3/uL (ref 0.0–0.1)
Basophils Relative: 1 %
Eosinophils Absolute: 0.1 10*3/uL (ref 0.0–0.5)
Eosinophils Relative: 2 %
HCT: 40.9 % (ref 39.0–52.0)
Hemoglobin: 14.6 g/dL (ref 13.0–17.0)
Immature Granulocytes: 0 %
Lymphocytes Relative: 26 %
Lymphs Abs: 1.4 10*3/uL (ref 0.7–4.0)
MCH: 32.8 pg (ref 26.0–34.0)
MCHC: 35.7 g/dL (ref 30.0–36.0)
MCV: 91.9 fL (ref 80.0–100.0)
Monocytes Absolute: 0.5 10*3/uL (ref 0.1–1.0)
Monocytes Relative: 10 %
Neutro Abs: 3.2 10*3/uL (ref 1.7–7.7)
Neutrophils Relative %: 61 %
Platelets: 178 10*3/uL (ref 150–400)
RBC: 4.45 MIL/uL (ref 4.22–5.81)
RDW: 13.8 % (ref 11.5–15.5)
WBC: 5.2 10*3/uL (ref 4.0–10.5)
nRBC: 0 % (ref 0.0–0.2)

## 2022-01-06 LAB — TROPONIN I (HIGH SENSITIVITY)
Troponin I (High Sensitivity): 314 ng/L (ref <18)
Troponin I (High Sensitivity): 312 ng/L (ref <18)
Troponin I (High Sensitivity): 340 ng/L (ref <18)

## 2022-01-06 LAB — I-STAT CHEM 8, ED
BUN: 21 mg/dL (ref 8–23)
Calcium, Ion: 1.07 mmol/L — ABNORMAL LOW (ref 1.15–1.40)
Chloride: 104 mmol/L (ref 98–111)
Creatinine, Ser: 2 mg/dL — ABNORMAL HIGH (ref 0.61–1.24)
Glucose, Bld: 202 mg/dL — ABNORMAL HIGH (ref 70–99)
HCT: 40 % (ref 39.0–52.0)
Hemoglobin: 13.6 g/dL (ref 13.0–17.0)
Potassium: 3.6 mmol/L (ref 3.5–5.1)
Sodium: 139 mmol/L (ref 135–145)
TCO2: 21 mmol/L — ABNORMAL LOW (ref 22–32)

## 2022-01-06 LAB — ECHOCARDIOGRAM COMPLETE: S' Lateral: 2.8 cm

## 2022-01-06 LAB — PHOSPHORUS: Phosphorus: 3.4 mg/dL (ref 2.5–4.6)

## 2022-01-06 LAB — MAGNESIUM: Magnesium: 1.9 mg/dL (ref 1.7–2.4)

## 2022-01-06 LAB — GLUCOSE, CAPILLARY: Glucose-Capillary: 183 mg/dL — ABNORMAL HIGH (ref 70–99)

## 2022-01-06 MED ORDER — POTASSIUM CHLORIDE 10 MEQ/100ML IV SOLN: 10.0000 meq | INTRAVENOUS | Status: DC

## 2022-01-06 MED ORDER — GLIPIZIDE ER 5 MG PO TB24
5.0000 mg | ORAL_TABLET | Freq: Two times a day (BID) | ORAL | Status: DC
  Administered 2022-01-07 – 2022-01-10 (×6): 5 mg via ORAL
  Filled 2022-01-06 (×9): qty 1

## 2022-01-06 MED ORDER — FUROSEMIDE 10 MG/ML IJ SOLN
40.0000 mg | Freq: Once | INTRAMUSCULAR | Status: AC
  Administered 2022-01-06: 40 mg via INTRAVENOUS

## 2022-01-06 MED ORDER — POTASSIUM CHLORIDE 10 MEQ/100ML IV SOLN
10.0000 meq | INTRAVENOUS | Status: AC
  Administered 2022-01-06 – 2022-01-07 (×4): 10 meq via INTRAVENOUS
  Filled 2022-01-06: qty 100

## 2022-01-06 MED ORDER — BENAZEPRIL HCL 20 MG PO TABS
40.0000 mg | ORAL_TABLET | Freq: Every day | ORAL | Status: DC
Start: 2022-01-07 — End: 2022-01-10
  Administered 2022-01-08 – 2022-01-10 (×3): 40 mg via ORAL
  Filled 2022-01-06 (×3): qty 1
  Filled 2022-01-06 (×3): qty 2

## 2022-01-06 MED ORDER — AMLODIPINE BESYLATE 5 MG PO TABS
5.0000 mg | ORAL_TABLET | Freq: Every day | ORAL | Status: DC
  Filled 2022-01-06 (×2): qty 1

## 2022-01-06 MED ORDER — NITROGLYCERIN 0.4 MG SL SUBL
0.4000 mg | SUBLINGUAL_TABLET | SUBLINGUAL | Status: DC | PRN
Start: 2022-01-06 — End: 2022-01-06
  Administered 2022-01-06 (×2): 0.4 mg via SUBLINGUAL

## 2022-01-06 MED ORDER — NITROGLYCERIN IN D5W 200-5 MCG/ML-% IV SOLN
0.0000 ug/min | INTRAVENOUS | Status: DC
  Administered 2022-01-06: 10 ug/min via INTRAVENOUS
  Filled 2022-01-06: qty 250

## 2022-01-06 MED ORDER — NITROGLYCERIN IN D5W 200-5 MCG/ML-% IV SOLN
0.0000 ug/min | INTRAVENOUS | Status: DC
  Administered 2022-01-06: 80 ug/min via INTRAVENOUS
  Administered 2022-01-07 (×2): 55 ug/min via INTRAVENOUS
  Administered 2022-01-08 – 2022-01-09 (×2): 100 ug/min via INTRAVENOUS
  Administered 2022-01-09: 95 ug/min via INTRAVENOUS
  Administered 2022-01-09: 100 ug/min via INTRAVENOUS
  Administered 2022-01-10: 110 ug/min via INTRAVENOUS
  Filled 2022-01-06 (×7): qty 250

## 2022-01-06 MED ORDER — PRAVASTATIN SODIUM 40 MG PO TABS
20.0000 mg | ORAL_TABLET | Freq: Every day | ORAL | Status: DC
  Administered 2022-01-07 – 2022-01-09 (×3): 20 mg via ORAL
  Filled 2022-01-06 (×3): qty 1

## 2022-01-06 NOTE — ED Notes (Signed)
Pt called out stating he is having difficulty breathing. Respirations even and unlabored. Oxygen saturations noted to be 91%. Placed pt on 2L  with improvement to 95%. Admitting MD paged.

## 2022-01-06 NOTE — Progress Notes (Addendum)
Called back to see patient for acute SOB.   Pt now on 6l O2 with 2-3 word conversational dyspnea with developing chest tightness. Wife states he has intermittently complained of the chest tightness over the past few days, but the dyspnea is new.   Dr. Lovena Le in to see. Stat CXR. Give Sublingual NTG and dose of IV lasix.   HS troponin repeat sent and pending.   Change bed to ICU.   Arthur Carr [address] [place], [place]  01/06/2022 3:18 PM

## 2022-01-06 NOTE — Progress Notes (Signed)
RT NOTE:  Pt transported to 2H04 on BIPAP without event. Report given to Tim, RRT.

## 2022-01-06 NOTE — ED Provider Notes (Addendum)
[facility] EMERGENCY DEPARTMENT Provider Note   CSN: [id number] Arrival date & time: 01/06/22  1150     History  No chief complaint on file.   Arthur Carr is a 71 y.o. male.  HPI     71 year old male comes in with chief complaint of exertional shortness of breath, fatigue and weakness.  Patient states that he has had the symptoms for about 2 weeks.  He saw his PCP today, they noted that his heart rate was in the 30s and he was sent to the emergency room.  Patient has past medical history of hypertension, diabetes, CKD.  He denies any coronary artery disease.  He denies any chest pain, recent illnesses with URI or any tick bite.  At rest, patient is comfortable and has no chest pain.  No new medications.  He does have a history of kidney disease, however denies any recent urinary change. Home Medications Prior to Admission medications   Medication Sig Start Date End Date Taking? Authorizing Provider  acetaminophen (TYLENOL) 500 MG tablet Take 1,000 mg by mouth every 6 (six) hours as needed for mild pain.   Yes [provider]  amLODipine (NORVASC) 5 MG tablet Take 5 mg by mouth daily.   Yes [provider]  aspirin EC 81 MG tablet Take 81 mg by mouth 2 (two) times a week. Swallow whole.   Yes [provider]  benazepril (LOTENSIN) 40 MG tablet Take 40 mg by mouth daily with breakfast.   Yes [provider]  carvedilol (COREG) 12.5 MG tablet Take 12.5 mg by mouth 2 (two) times daily. 11/20/21  Yes [provider]  furosemide (LASIX) 80 MG tablet Take 80 mg by mouth 2 (two) times daily.   Yes [provider]  glipiZIDE (GLUCOTROL XL) 5 MG 24 hr tablet Take 5 mg by mouth 2 (two) times daily.   Yes [provider]  ibuprofen (ADVIL,MOTRIN) 200 MG tablet Take 1 tablet (200 mg total) by mouth 2 (two) times daily as needed for fever. Patient taking differently: Take 200 mg by mouth 2 (two) times daily as  needed for fever or mild pain. 11/09/13  Yes Donnajean Lopes, MD  metFORMIN (GLUCOPHAGE) 500 MG tablet Take 500 mg by mouth 2 (two) times daily with a meal.   Yes [provider]  OZEMPIC, 1 MG/DOSE, 4 MG/3ML SOPN Inject 1 mg into the skin once a week. Thursday 12/14/21  Yes [provider]  pravastatin (PRAVACHOL) 20 MG tablet Take 20 mg by mouth daily.   Yes [provider]      Allergies    Patient has no known allergies.    Review of Systems   Review of Systems  All other systems reviewed and are negative.   Physical Exam Updated Vital Signs BP (!) 184/115   Pulse (!) 31   Temp 97.8 F (36.6 C)   Resp (!) 25   SpO2 97%  Physical Exam Vitals and nursing note reviewed.  Constitutional:      Appearance: He is well-developed.  HENT:     Head: Atraumatic.  Eyes:     Extraocular Movements: Extraocular movements intact.     Pupils: Pupils are equal, round, and reactive to light.  Cardiovascular:     Rate and Rhythm: Bradycardia present.  Pulmonary:     Effort: Pulmonary effort is normal.  Musculoskeletal:     Cervical back: Neck supple.  Skin:    General: Skin is warm.  Neurological:     Mental Status: He is alert and oriented to person, place, and time.     ED Results / Procedures / Treatments   Labs (all labs ordered are listed, but only abnormal results are displayed) Labs Reviewed  BASIC METABOLIC PANEL - Abnormal; Notable for the following components:      Result Value   CO2 20 (*)    Glucose, Bld 207 (*)    Creatinine, Ser 1.90 (*)    Calcium 8.5 (*)    GFR, Estimated 37 (*)    All other components within normal limits  I-STAT CHEM 8, ED - Abnormal; Notable for the following components:   Creatinine, Ser 2.00 (*)    Glucose, Bld 202 (*)    Calcium, Ion 1.07 (*)    TCO2 21 (*)    All other components within normal limits  TROPONIN I (HIGH SENSITIVITY) - Abnormal; Notable for the following components:   Troponin I (High  Sensitivity) 314 (*)    All other components within normal limits  CBC WITH DIFFERENTIAL/PLATELET  MAGNESIUM  PHOSPHORUS  TSH  TROPONIN I (HIGH SENSITIVITY)    EKG EKG Interpretation  Date/Time:  Friday January 06 2022 11:59:47 EDT Ventricular Rate:  33 PR Interval:    QRS Duration: 143 QT Interval:  679 QTC Calculation: 504 R Axis:   9 Text Interpretation: Complete AV block with wide QRS complex Right bundle branch block heart block is new Confirmed by Varney Biles [phone]) on 01/06/2022 12:55:41 PM  Radiology ECHOCARDIOGRAM COMPLETE  Result Date: 01/06/2022    ECHOCARDIOGRAM REPORT   Patient Name:   Arthur Carr Date of Exam: 01/06/2022 Medical Rec #:  [id number]       Height:       71.0 in Accession #:    [id number]      Weight:       228.0 lb Date of Birth:  Sep 09, 1950       BSA:          2.229 m Patient Age:    65 years        BP:           161/58 mmHg Patient Gender: M               HR:           32 bpm. Exam Location:  Inpatient Procedure: 2D Echo, Color Doppler and Cardiac Doppler STAT ECHO Indications:    I44.2 Complete heart block  History:        Patient has no prior history of Echocardiogram examinations.                 Risk Factors:Hypertension and Diabetes.  Sonographer:    Raquel Sarna Senior RDCS Referring Phys: [id number] [place]  1. Left ventricular ejection fraction, by estimation, is 60 to 65%. The left ventricle has normal function. The left ventricle has no regional wall motion abnormalities. Left ventricular diastolic parameters are indeterminate.  2. Right ventricular systolic function is normal. The right ventricular size is normal. There is severely elevated pulmonary artery systolic pressure.  3. Left atrial size was moderately dilated.  4. The mitral valve is abnormal. Mild mitral valve regurgitation. No evidence of mitral stenosis.  5. Tricuspid valve regurgitation is mild to moderate.  6. The aortic valve is tricuspid. There is mild calcification  of the aortic valve. Aortic valve regurgitation is not visualized. Aortic valve sclerosis is present, with no evidence of aortic valve stenosis.  7. The inferior vena cava is normal in size with greater than 50% respiratory variability, suggesting right atrial pressure of 3 mmHg. FINDINGS  Left Ventricle: Left ventricular ejection fraction, by estimation, is 60 to 65%. The left ventricle has normal function. The left ventricle has no regional wall motion abnormalities. The left ventricular internal cavity size was normal in size. There is  no left ventricular hypertrophy. Left ventricular diastolic parameters are indeterminate. Right Ventricle: The right ventricular size is normal. No increase in right ventricular wall thickness. Right ventricular systolic function is normal. There is severely elevated pulmonary artery systolic pressure. The tricuspid regurgitant velocity is 3.60 m/s, and with an assumed right atrial pressure of 15 mmHg, the estimated right ventricular systolic pressure is 40.9 mmHg. Left Atrium: Left atrial size was moderately dilated. Right Atrium: Right atrial size was normal in size. Pericardium: There is no evidence of pericardial effusion. Mitral Valve: The mitral valve is abnormal. There is mild thickening of the mitral valve leaflet(s). There is mild calcification of the mitral valve leaflet(s). Mild mitral annular calcification. Mild mitral valve regurgitation. No evidence of mitral valve stenosis. Tricuspid Valve: The tricuspid valve is normal in structure. Tricuspid valve regurgitation is mild to moderate. No evidence of tricuspid stenosis. Aortic Valve: The aortic valve is tricuspid. There is mild calcification of the aortic valve. Aortic valve regurgitation is not visualized. Aortic valve sclerosis is present, with no evidence of aortic valve stenosis. Pulmonic Valve: The pulmonic valve was normal in structure. Pulmonic valve regurgitation is not visualized. No evidence of pulmonic  stenosis. Aorta: The aortic root is normal in size and structure. Venous: The inferior vena cava is normal in size with greater than 50% respiratory variability, suggesting right atrial pressure of 3 mmHg. IAS/Shunts: No atrial level shunt detected by color flow Doppler.  LEFT VENTRICLE PLAX 2D LVIDd:         5.20 cm LVIDs:         2.80 cm LV PW:         0.90 cm LV IVS:        1.00 cm LVOT diam:     2.20 cm LV SV:         118 LV SV Index:   53 LVOT Area:     3.80 cm  RIGHT VENTRICLE TAPSE (M-mode): 2.2 cm LEFT ATRIUM             Index        RIGHT ATRIUM           Index LA diam:        4.30 cm 1.93 cm/m   RA Area:     16.70 cm LA Vol (A2C):   58.0 ml 26.02 ml/m  RA Volume:   50.40 ml  22.61 ml/m LA Vol (A4C):   57.8 ml 25.93 ml/m LA Biplane Vol: 59.7 ml 26.78 ml/m  AORTIC VALVE LVOT Vmax:   139.00 cm/s LVOT Vmean:  84.200 cm/s LVOT VTI:    0.310 m  AORTA Ao Root diam: 3.30 cm TRICUSPID VALVE TR Peak grad:   51.8 mmHg TR Vmax:        360.00 cm/s  SHUNTS Systemic VTI:  0.31 m Systemic Diam: 2.20 cm Jenkins Rouge MD Electronically signed by Jenkins Rouge MD Signature Date/Time: 01/06/2022/1:25:32 PM    Final     Procedures .Critical Care  Performed by: Varney Biles, MD Authorized by: Varney Biles, MD   Critical care provider statement:    Critical care time (minutes):  61   Critical care was necessary to treat or prevent imminent or life-threatening deterioration of the following conditions:  Cardiac failure and circulatory failure   Critical care was time spent personally by me on the following activities:  Development of treatment plan with patient or surrogate, discussions with consultants, evaluation of patient's response to treatment, examination of patient, ordering and review of laboratory studies, ordering and review of radiographic studies, ordering and performing treatments and interventions, pulse oximetry, re-evaluation of patient's condition and review of old charts     Medications  Ordered in ED Medications  furosemide (LASIX) injection 40 mg (has no administration in time range)  nitroGLYCERIN (NITROSTAT) SL tablet 0.4 mg (has no administration in time range)    ED Course/ Medical Decision Making/ A&P Clinical Course as of 01/06/22 1546  Fri Jan 06, 2022  1518 Patient is having worsening shortness of breath.  I have notified cardiology service to reassess the patient.  They will come and see the patient.  Chest x-ray has been ordered. No JVD, wondering if this is volume overload or poor perfusion to the heart due to bradycardia in the setting of patient also having elevated troponin.  Cardiology service has already ordered a repeat troponin. [AN]  [place] [facility] team is seen the patient.  With his blood pressure rising, they requested nitro sublingual to nursing staff.  His BP is high, he has increased work of breathing, therefore I ordered BiPAP.  I reviewed the x-ray and interpreted independently.  There is clear evidence of pulmonary edema.  Repeat troponin is pending at this time.  Patient is going to cardiac ICU.  Cardiology service plans to put in a transvenous pacer. [AN]    Clinical Course User Index [AN] Varney Biles, MD                           Medical Decision Making Amount and/or Complexity of Data Reviewed Labs: ordered.  Risk Decision regarding hospitalization.   This patient presents to the ED with chief complaint(s) of weakness, fatigue and low heart rate with pertinent past medical history of CKD, diabetes which further complicates the presenting complaint. The complaint involves an extensive differential diagnosis and also carries with it a high risk of complications and morbidity.    Patient's EKG shows complete heart block.  The differential diagnosis for the heart block includes completed heart attack, sick sinus syndrome, hyperkalemia, acute on chronic renal failure, severe electrolyte abnormality, medication side effect,  hypothyroidism.   The initial plan is to order basic blood work, including i-STAT Chem-8 so that we can get his potassium checked quickly.  Patient is not in cardiogenic shock at this time.  His blood pressure is stable.  He is perfusing his brain at rest.  We will monitor his cardiopulmonary status closely.   Additional history obtained: Records reviewed Primary Care Documents -reviewed patient's medications and baseline creatinine  Independent labs interpretation:  The following labs were independently interpreted: Creatinine is slightly elevated than normal at 2, but there is no hyperkalemia.. The patient's troponin is elevated.  Independent visualization of imaging: - I independently visualized the following imaging with scope of interpretation limited to determining acute life threatening conditions related to emergency care: Patient's echocardiogram, which revealed no severe pericardial effusion, ejection fraction appears to be normal.  Treatment and Reassessment: Patient's telemetry monitoring is unchanged.  He has been placed on cardiac telemetry and also we have pacemaker pads on him.  Cardiology services been consulted, it appears that they will admit the patient. For now, no need for atropine or any pressors as patient remains stable hemodynamically.  Consultation: - Consulted or discussed management/test interpretation w/ external professional: Cardiology service, who will admit the patient.   Final Clinical Impression(s) / ED Diagnoses Final diagnoses:  Complete heart block [facility])    Rx / DC Orders ED Discharge Orders     None         Varney Biles, MD 01/06/22 [place], Shayra Anton, MD 01/06/22 [place], Anglea Gordner, MD 01/06/22 1546

## 2022-01-06 NOTE — ED Notes (Signed)
Dr. Kathrynn Humble aware of Troponin 314.

## 2022-01-06 NOTE — ED Notes (Signed)
Pt again called out feeling worsening shob. Pt talking in broken sentences. Readjusted in the bed and oxygen increased to 6L [place] to maintain saturation of 94%.

## 2022-01-06 NOTE — ED Triage Notes (Signed)
Pt here via ems with reports of having exertional shob and fatigue for the last week. Went to PCP and was found to have HR 30. Pt in complete heart block at this time. Alert and oriented.  130/80 HR 30 97% RA

## 2022-01-06 NOTE — Progress Notes (Signed)
Cards APP paged  for primary RN Marland Kitchen regarding new pt from ED in 2H04, CHB rate 30s, on BiPAP, refractory HTN SBP 210 on 115mg/min Nitroglycerin gtt. Awaiting callback/further orders.

## 2022-01-06 NOTE — Progress Notes (Signed)
Echocardiogram 2D Echocardiogram has been performed.  Oneal Deputy Marlisa Caridi RDCS 01/06/2022, 1:11 PM

## 2022-01-06 NOTE — Progress Notes (Signed)
Paged by nurse regarding elevated SBP >200, ordered amlodipine to be given only for SBP >200. Would try to keep his SBP on the higher side given complete heart block to guarantee cerebral perfusion pressure.  Patient is completely asymptomatic. Last dose of coreg 12.'5mg'$  was this morning. He is aware to call nurse if has severe dizziness or feeling of passing out. Nurse to page on call staff if HR <30 bpm.

## 2022-01-06 NOTE — H&P (Addendum)
ELECTROPHYSIOLOGY CONSULT NOTE    Patient ID: Arthur Carr MRN: [id number], DOB/AGE: 1951/02/28 71 y.o.  Admit date: 01/06/2022 Date of Consult: 01/06/2022  Primary Physician: Ginger Organ., MD Primary Cardiologist: None  Electrophysiologist:  New  Reason for admission: Complete heart block  Patient Profile: Arthur Carr is a 71 y.o. male with a history of DM2, CKD2, and HLD who is being seen today for the evaluation of CHB at the request of Dr. Kathrynn Humble.  HPI:  Arthur Carr is a 71 y.o. male with medical history as above.   Pt has noted DOE and fatigue x 1 week. Seen by PCP for same this am and noted to have HR in the 30s. Found to be in CHB and sent to ED for further evaluation and treatment.   I stat on arrival shows K 3.6, Cr 2.00 (Unclear baseline, last results 1.1-14 but 8 years ago), Hgb 13.6, WBC 5.2. Other labs pending.   Echo done which shows LVEF 60-65%, Normal RV, + Pulmonary hypertension.   Pt is awake and alert with NAD at rest. He states he has had fatigue and DOE with minimal exertion over the past week, his wife says 2 weeks. He denies chest pain, N/V/D, recent illness, or history of syncope. He denies every being told he has had slow heart rates.  He IS taking carvedilol, and took 12.5 mg BID dose this am.   Past Medical History:  Diagnosis Date   Arthritis    Diabetes mellitus without complication ([place])    Hypertension    Prostate cancer [facility])      Surgical History:  Past Surgical History:  Procedure Laterality Date   JOINT REPLACEMENT  2007   RT TOTAL HIP   LYMPHADENECTOMY Bilateral 08/06/2013   Procedure: LYMPHADENECTOMY AND INDOCYANINE GREEN DYE  INJECTION;  Surgeon: Alexis Frock, MD;  Location: [facility] ORS;  Service: Urology;  Laterality: Bilateral;   ROBOT ASSISTED LAPAROSCOPIC RADICAL PROSTATECTOMY N/A 08/06/2013   Procedure: ROBOTIC ASSISTED LAPAROSCOPIC RADICAL PROSTATECTOMY/OPEN UMBILICAL HERNIA REPAIR;  Surgeon: Alexis Frock, MD;   Location: [facility] ORS;  Service: Urology;  Laterality: N/A;     (Not in a hospital admission)   Inpatient Medications:   Allergies: No Known Allergies  Social History   Socioeconomic History   Marital status: Married    Spouse name: Not on file   Number of children: Not on file   Years of education: Not on file   Highest education level: Not on file  Occupational History   Not on file  Tobacco Use   Smoking status: Every Day    Packs/day: 0.50    Types: Cigarettes   Smokeless tobacco: Former  Substance and Sexual Activity   Alcohol use: No   Drug use: No   Sexual activity: Not Currently  Other Topics Concern   Not on file  Social History Narrative   Not on file   Social Determinants of Health   Financial Resource Strain: Not on file  Food Insecurity: Not on file  Transportation Needs: Not on file  Physical Activity: Not on file  Stress: Not on file  Social Connections: Not on file  Intimate Partner Violence: Not on file     No family history on file.   Review of Systems: All other systems reviewed and are otherwise negative except as noted above.  Physical Exam: Vitals:   01/06/22 1201 01/06/22 1215 01/06/22 1245  BP: (!) 157/54 (!) 172/55 (!) 157/62  Pulse: (!) 33 (!) 33 Marland Kitchen)  32  Resp: '13 19 12  '$ Temp: 97.8 F (36.6 C)    SpO2: 96% 96% 94%    GEN- The patient is well appearing, alert and oriented x 3 today.   HEENT: normocephalic, atraumatic; sclera clear, conjunctiva pink; hearing intact; oropharynx clear; neck supple Lungs- Diminished throughout with mild wheeze. Normal work of breathing at time of initial exam.  Heart- slow but regular rate and rhythm.  GI- soft, non-tender, non-distended, bowel sounds present Extremities- no clubbing or cyanosis. 1-2+ ankle edema; DP/PT/radial pulses 2+ bilaterally MS- no significant deformity or atrophy Skin- warm and dry, no rash or lesion Psych- euthymic mood, full affect Neuro- strength and sensation are  intact  Labs:   Lab Results  Component Value Date   WBC 5.2 01/06/2022   HGB 13.6 01/06/2022   HCT 40.0 01/06/2022   MCV 91.9 01/06/2022   PLT 178 01/06/2022    Recent Labs  Lab 01/06/22 1216  NA 139  K 3.6  CL 104  BUN 21  CREATININE 2.00*  GLUCOSE 202*      Radiology/Studies: No results found.  EKG: on arrival shows CHB with 1 conducted beat, in 30s (personally reviewed)  TELEMETRY: Complete heart block in 30-40s with occasional conducted beats.  (personally reviewed)  Assessment/Plan: 1.  Advanced AV block Symptomatic with fatigue and DOE He is on carvedilol with a dose as recently as this am.  Echo with normal EF Initial HS trop 314. Continue to trend.   2. HTN Avoid AV nodal agents; If requires pacing will resume BB after  3. HLD 4. DM2 He is at high risk for CAD. Continue to trend troponin, though possibly elevated due to CHM.   Dr. Lovena Le to see the patient. If conduction does not improve, may need pacemaker over the weekend or Monday. If HS troponin continues to trend up may have to consider cath first.   Will admit to progressive.   ADDENDUM See progress note. Pt with worsening HTN emergency with pulmonary edema on exam and CXR with worsening SOB and chest tightness. HS trop pending.  Change bed to ICU.   For questions or updates, please contact [place] Please consult [email or web address] for contact info under Cardiology/STEMI.  Jacalyn Lefevre, PA-C  01/06/2022 1:15 PM  EP Attending  Patient seen and examined. Agree with above. He is a pleasant 71 yo man with a h/o HTN who presents with worsening sob and found to have CHB. He has LBBB with a RBBB escape. He has had evidence of volume overload and low perfusion as his creatinine is up. He has been sick over a week. In the ER he has had progressive dyspnea with the need for bipap and has been treated with IV lasix and IV NTG and his bp is still elevated. He took coreg early this  morning. Exam demonstrates a pleasant 71 yo man, dyspneic and in mild respiratory difficulty. He can give one or two word answers. On exam his has a regular brady and scatterd rales over 1/2 up bilaterally and elevated JVD. His abdomen is obese with bowel sounds present and extremities have 3+ edema. Neuro is non-focal. ECG demonstrates NSR with CHB and rare conducted beats. Labs demonstrate a slightly elevated but non-climbing troponin.  He will be admitted to the ICU and continued on bipap and IV lasix and IV NTG. Hopefully his conduction will improve after his coreg has washed out. If not, PPM insertion tomorrow will be indicated.   Carleene Overlie Bekah Igoe,MD

## 2022-01-06 NOTE — ED Notes (Signed)
Pt placed on zoll pads 

## 2022-01-07 ENCOUNTER — Inpatient Hospital Stay (HOSPITAL_COMMUNITY)
Admission: EM | Disposition: A | Discharge status: Home / Self Care | Payer: Self-pay | Source: Home / Self Care | Attending: Internal Medicine

## 2022-01-07 DIAGNOSIS — I442 Atrioventricular block, complete: Secondary | ICD-10-CM | POA: Diagnosis not present

## 2022-01-07 HISTORY — PX: PACEMAKER IMPLANT: EP1218

## 2022-01-07 LAB — BASIC METABOLIC PANEL
Anion gap: 11 (ref 5–15)
BUN: 18 mg/dL (ref 8–23)
CO2: 22 mmol/L (ref 22–32)
Calcium: 8.3 mg/dL — ABNORMAL LOW (ref 8.9–10.3)
Chloride: 104 mmol/L (ref 98–111)
Creatinine, Ser: 1.73 mg/dL — ABNORMAL HIGH (ref 0.61–1.24)
GFR, Estimated: 41 mL/min — ABNORMAL LOW (ref >60)
Glucose, Bld: 117 mg/dL — ABNORMAL HIGH (ref 70–99)
Potassium: 3.4 mmol/L — ABNORMAL LOW (ref 3.5–5.1)
Sodium: 137 mmol/L (ref 135–145)

## 2022-01-07 LAB — SURGICAL PCR SCREEN
MRSA, PCR: NEGATIVE
Staphylococcus aureus: POSITIVE — AB

## 2022-01-07 LAB — GLUCOSE, CAPILLARY: Glucose-Capillary: 124 mg/dL — ABNORMAL HIGH (ref 70–99)

## 2022-01-07 SURGERY — PACEMAKER IMPLANT
Anesthesia: LOCAL

## 2022-01-07 MED ORDER — POTASSIUM CHLORIDE 10 MEQ/100ML IV SOLN
10.0000 meq | INTRAVENOUS | Status: AC
  Administered 2022-01-07 (×4): 10 meq via INTRAVENOUS
  Filled 2022-01-07: qty 100

## 2022-01-07 MED ORDER — ONDANSETRON HCL 4 MG/2ML IJ SOLN: 4.0000 mg | Freq: Four times a day (QID) | INTRAMUSCULAR | Status: DC | PRN

## 2022-01-07 MED ORDER — MIDAZOLAM HCL 5 MG/5ML IJ SOLN
INTRAMUSCULAR | Status: DC | PRN
  Administered 2022-01-07: 1 mg via INTRAVENOUS

## 2022-01-07 MED ORDER — CEFAZOLIN SODIUM-DEXTROSE 1-4 GM/50ML-% IV SOLN
1.0000 g | Freq: Three times a day (TID) | INTRAVENOUS | Status: AC
  Administered 2022-01-07 (×2): 1 g via INTRAVENOUS
  Filled 2022-01-07 (×2): qty 50

## 2022-01-07 MED ORDER — HEPARIN (PORCINE) IN NACL 1000-0.9 UT/500ML-% IV SOLN
INTRAVENOUS | Status: DC | PRN
  Administered 2022-01-07: 500 mL

## 2022-01-07 MED ORDER — CHLORHEXIDINE GLUCONATE CLOTH 2 % EX PADS
6.0000 | MEDICATED_PAD | Freq: Every day | CUTANEOUS | Status: DC
  Administered 2022-01-08 – 2022-01-10 (×2): 6 via TOPICAL

## 2022-01-07 MED ORDER — MUPIROCIN 2 % EX OINT
1.0000 | TOPICAL_OINTMENT | Freq: Two times a day (BID) | CUTANEOUS | Status: DC
  Administered 2022-01-07 – 2022-01-10 (×7): 1 via NASAL
  Filled 2022-01-07 (×3): qty 22

## 2022-01-07 MED ORDER — FUROSEMIDE 10 MG/ML IJ SOLN
INTRAMUSCULAR | Status: AC
  Filled 2022-01-07: qty 4

## 2022-01-07 MED ORDER — MIDAZOLAM HCL 5 MG/5ML IJ SOLN
INTRAMUSCULAR | Status: AC
  Filled 2022-01-07: qty 5

## 2022-01-07 MED ORDER — CEFAZOLIN SODIUM-DEXTROSE 2-4 GM/100ML-% IV SOLN
2.0000 g | INTRAVENOUS | Status: AC
  Administered 2022-01-07: 2 g via INTRAVENOUS
  Filled 2022-01-07: qty 100

## 2022-01-07 MED ORDER — FENTANYL CITRATE (PF) 100 MCG/2ML IJ SOLN
INTRAMUSCULAR | Status: DC | PRN
Start: 2022-01-07 — End: 2022-01-07
  Administered 2022-01-07: 12.5 ug via INTRAVENOUS

## 2022-01-07 MED ORDER — SODIUM CHLORIDE 0.9 % IV SOLN
INTRAVENOUS | Status: AC
  Filled 2022-01-07: qty 2

## 2022-01-07 MED ORDER — POTASSIUM CHLORIDE CRYS ER 20 MEQ PO TBCR
20.0000 meq | EXTENDED_RELEASE_TABLET | Freq: Two times a day (BID) | ORAL | Status: AC
Start: 2022-01-07 — End: 2022-01-07
  Administered 2022-01-07 (×2): 20 meq via ORAL
  Filled 2022-01-07 (×2): qty 1

## 2022-01-07 MED ORDER — LIDOCAINE HCL (PF) 1 % IJ SOLN
INTRAMUSCULAR | Status: DC | PRN
  Administered 2022-01-07: 60 mL

## 2022-01-07 MED ORDER — HEPARIN (PORCINE) IN NACL 1000-0.9 UT/500ML-% IV SOLN
INTRAVENOUS | Status: AC
  Filled 2022-01-07: qty 500

## 2022-01-07 MED ORDER — ACETAMINOPHEN 325 MG PO TABS: 325.0000 mg | ORAL_TABLET | ORAL | Status: DC | PRN

## 2022-01-07 MED ORDER — SODIUM CHLORIDE 0.9 % IV SOLN: INTRAVENOUS | Status: DC

## 2022-01-07 MED ORDER — SODIUM CHLORIDE 0.9 % IV SOLN
80.0000 mg | INTRAVENOUS | Status: AC
  Administered 2022-01-07: 80 mg

## 2022-01-07 MED ORDER — CARVEDILOL 12.5 MG PO TABS
12.5000 mg | ORAL_TABLET | Freq: Two times a day (BID) | ORAL | Status: DC
  Administered 2022-01-07 – 2022-01-08 (×3): 12.5 mg via ORAL
  Filled 2022-01-07 (×4): qty 1

## 2022-01-07 MED ORDER — FUROSEMIDE 10 MG/ML IJ SOLN
INTRAMUSCULAR | Status: DC | PRN
  Administered 2022-01-07: 60 mg via INTRAVENOUS

## 2022-01-07 MED ORDER — LIDOCAINE HCL (PF) 1 % IJ SOLN
INTRAMUSCULAR | Status: AC
  Filled 2022-01-07: qty 60

## 2022-01-07 MED ORDER — FENTANYL CITRATE (PF) 100 MCG/2ML IJ SOLN
INTRAMUSCULAR | Status: AC
  Filled 2022-01-07: qty 2

## 2022-01-07 MED ORDER — HYDRALAZINE HCL 20 MG/ML IJ SOLN
INTRAMUSCULAR | Status: AC
  Filled 2022-01-07: qty 1

## 2022-01-07 SURGICAL SUPPLY — 10 items
CABLE SURGICAL S-101-97-12 (CABLE) ×2 IMPLANT
KIT ACCESSORY SELECTRA FIX CVD (MISCELLANEOUS) ×1 IMPLANT
LEAD SELECTRA 3D-55-42 (CATHETERS) ×1 IMPLANT
LEAD SOLIA S PRO MRI 53 (Lead) ×1 IMPLANT
LEAD SOLIA S PRO MRI 60 (Lead) ×1 IMPLANT
PACEMAKER EDORA 8DR-T MRI (Pacemaker) ×1 IMPLANT
PAD DEFIB RADIO PHYSIO CONN (PAD) ×2 IMPLANT
SHEATH 7FR PRELUDE SNAP 13 (SHEATH) ×1 IMPLANT
SHEATH 9FR PRELUDE SNAP 13 (SHEATH) ×1 IMPLANT
TRAY PACEMAKER INSERTION (PACKS) ×3 IMPLANT

## 2022-01-07 NOTE — Progress Notes (Signed)
Patient stated he was having no shortness of breath at this time. Patient on 4lpm with Sp02-97%. Bipap on standby

## 2022-01-07 NOTE — Progress Notes (Signed)
RT assisted with patient transport on Bipap from 2H04 to cath lab. Patient tolerated well.

## 2022-01-07 NOTE — Progress Notes (Signed)
RT assisted with patient transport on Bipap from cath lab to 4H53 without complications.

## 2022-01-07 NOTE — Progress Notes (Signed)
Progress Note  Patient Name: Arthur Carr Date of Encounter: 01/07/2022  Primary Cardiologist: None   Subjective   Dyspnea improved. No chest pain.   Inpatient Medications    Scheduled Meds:  amLODipine  5 mg Oral Daily   benazepril  40 mg Oral Q breakfast   Chlorhexidine Gluconate Cloth  6 each Topical Daily   gentamicin (GARAMYCIN) 80 mg in sodium chloride 0.9 % 500 mL irrigation  80 mg Irrigation On Call   glipiZIDE  5 mg Oral BID WC   pravastatin  20 mg Oral Q2200   Continuous Infusions:  sodium chloride     sodium chloride      ceFAZolin (ANCEF) IV     nitroGLYCERIN 55 mcg/min (01/07/22 0557)   PRN Meds:    Vital Signs    Vitals:   01/07/22 0400 01/07/22 0500 01/07/22 0700 01/07/22 0723  BP: (!) 183/70 (!) 174/66  (!) 179/65  Pulse: (!) 31 (!) 30    Resp: 14 19    Temp:   97.9 F (36.6 C)   TempSrc:   Axillary   SpO2: 95% 95%    Weight:      Height:        Intake/Output Summary (Last 24 hours) at 01/07/2022 0805 Last data filed at 01/06/2022 2100 Gross per 24 hour  Intake 93.76 ml  Output 400 ml  Net -306.24 ml   Filed Weights   01/06/22 1950  Weight: 109.8 kg    Telemetry    NSR with CHB - Personally Reviewed  ECG    NSR with CHB - Personally Reviewed  Physical Exam   GEN: No acute distress.   Neck: No JVD Cardiac: Reg brady, no murmurs, rubs, or gallops.  Respiratory: Clear to auscultation bilaterally with basilar rales. GI: Soft, nontender, non-distended  MS: No edema; No deformity. Neuro:  Nonfocal  Psych: Normal affect   Labs    Chemistry Recent Labs  Lab 01/06/22 1210 01/06/22 1216 01/07/22 0145  NA 138 139 137  K 3.7 3.6 3.4*  CL 106 104 104  CO2 20*  --  22  GLUCOSE 207* 202* 117*  BUN '20 21 18  '$ CREATININE 1.90* 2.00* 1.73*  CALCIUM 8.5*  --  8.3*  GFRNONAA 37*  --  41*  ANIONGAP 12  --  11     Hematology Recent Labs  Lab 01/06/22 1210 01/06/22 1216  WBC 5.2  --   RBC 4.45  --   HGB 14.6 13.6  HCT  40.9 40.0  MCV 91.9  --   MCH 32.8  --   MCHC 35.7  --   RDW 13.8  --   PLT 178  --     Cardiac EnzymesNo results for input(s): "TROPONINI" in the last 168 hours. No results for input(s): "TROPIPOC" in the last 168 hours.   BNPNo results for input(s): "BNP", "PROBNP" in the last 168 hours.   DDimer No results for input(s): "DDIMER" in the last 168 hours.   Radiology    DG Chest Portable 1 View  Result Date: 01/06/2022 CLINICAL DATA:  Shortness of breath.  Chest pain. EXAM: PORTABLE CHEST 1 VIEW COMPARISON:  Two-view chest x-ray 11/07/2013 FINDINGS: The heart size exaggerated by low lung volumes. Atherosclerotic changes are present in the aortic arch. Asymmetric airspace disease is present in the right lower lobe. Mild pulmonary vascular congestion is present. No other significant airspace consolidation is present. IMPRESSION: 1. Asymmetric airspace disease in the right lower lobe concerning for  pneumonia. 2. Mild pulmonary vascular congestion. 3. Atherosclerosis. Electronically Signed   By: San Morelle M.D.   On: 01/06/2022 15:30   ECHOCARDIOGRAM COMPLETE  Result Date: 01/06/2022    ECHOCARDIOGRAM REPORT   Patient Name:   Arthur Carr Date of Exam: 01/06/2022 Medical Rec #:  [id number]       Height:       71.0 in Accession #:    [id number]      Weight:       228.0 lb Date of Birth:  1950/12/02       BSA:          2.229 m Patient Age:    71 years        BP:           161/58 mmHg Patient Gender: M               HR:           32 bpm. Exam Location:  Inpatient Procedure: 2D Echo, Color Doppler and Cardiac Doppler STAT ECHO Indications:    I44.2 Complete heart block  History:        Patient has no prior history of Echocardiogram examinations.                 Risk Factors:Hypertension and Diabetes.  Sonographer:    Raquel Sarna Senior RDCS Referring Phys: [id number] [place]  1. Left ventricular ejection fraction, by estimation, is 60 to 65%. The left ventricle has normal  function. The left ventricle has no regional wall motion abnormalities. Left ventricular diastolic parameters are indeterminate.  2. Right ventricular systolic function is normal. The right ventricular size is normal. There is severely elevated pulmonary artery systolic pressure.  3. Left atrial size was moderately dilated.  4. The mitral valve is abnormal. Mild mitral valve regurgitation. No evidence of mitral stenosis.  5. Tricuspid valve regurgitation is mild to moderate.  6. The aortic valve is tricuspid. There is mild calcification of the aortic valve. Aortic valve regurgitation is not visualized. Aortic valve sclerosis is present, with no evidence of aortic valve stenosis.  7. The inferior vena cava is normal in size with greater than 50% respiratory variability, suggesting right atrial pressure of 3 mmHg. FINDINGS  Left Ventricle: Left ventricular ejection fraction, by estimation, is 60 to 65%. The left ventricle has normal function. The left ventricle has no regional wall motion abnormalities. The left ventricular internal cavity size was normal in size. There is  no left ventricular hypertrophy. Left ventricular diastolic parameters are indeterminate. Right Ventricle: The right ventricular size is normal. No increase in right ventricular wall thickness. Right ventricular systolic function is normal. There is severely elevated pulmonary artery systolic pressure. The tricuspid regurgitant velocity is 3.60 m/s, and with an assumed right atrial pressure of 15 mmHg, the estimated right ventricular systolic pressure is 44.8 mmHg. Left Atrium: Left atrial size was moderately dilated. Right Atrium: Right atrial size was normal in size. Pericardium: There is no evidence of pericardial effusion. Mitral Valve: The mitral valve is abnormal. There is mild thickening of the mitral valve leaflet(s). There is mild calcification of the mitral valve leaflet(s). Mild mitral annular calcification. Mild mitral valve  regurgitation. No evidence of mitral valve stenosis. Tricuspid Valve: The tricuspid valve is normal in structure. Tricuspid valve regurgitation is mild to moderate. No evidence of tricuspid stenosis. Aortic Valve: The aortic valve is tricuspid. There is mild calcification of the aortic valve. Aortic valve regurgitation is not visualized.  Aortic valve sclerosis is present, with no evidence of aortic valve stenosis. Pulmonic Valve: The pulmonic valve was normal in structure. Pulmonic valve regurgitation is not visualized. No evidence of pulmonic stenosis. Aorta: The aortic root is normal in size and structure. Venous: The inferior vena cava is normal in size with greater than 50% respiratory variability, suggesting right atrial pressure of 3 mmHg. IAS/Shunts: No atrial level shunt detected by color flow Doppler.  LEFT VENTRICLE PLAX 2D LVIDd:         5.20 cm LVIDs:         2.80 cm LV PW:         0.90 cm LV IVS:        1.00 cm LVOT diam:     2.20 cm LV SV:         118 LV SV Index:   53 LVOT Area:     3.80 cm  RIGHT VENTRICLE TAPSE (M-mode): 2.2 cm LEFT ATRIUM             Index        RIGHT ATRIUM           Index LA diam:        4.30 cm 1.93 cm/m   RA Area:     16.70 cm LA Vol (A2C):   58.0 ml 26.02 ml/m  RA Volume:   50.40 ml  22.61 ml/m LA Vol (A4C):   57.8 ml 25.93 ml/m LA Biplane Vol: 59.7 ml 26.78 ml/m  AORTIC VALVE LVOT Vmax:   139.00 cm/s LVOT Vmean:  84.200 cm/s LVOT VTI:    0.310 m  AORTA Ao Root diam: 3.30 cm TRICUSPID VALVE TR Peak grad:   51.8 mmHg TR Vmax:        360.00 cm/s  SHUNTS Systemic VTI:  0.31 m Systemic Diam: 2.20 cm Jenkins Rouge MD Electronically signed by Jenkins Rouge MD Signature Date/Time: 01/06/2022/1:25:32 PM    Final     Cardiac Studies   2D echo with preserved LV function  Patient Profile     71 y.o. male admitted with CHB and CHF, now s/p IV diuresis. Heart block persists. He has been off of his coreg 12.5 bid for 24 hours.   Assessment & Plan    CHB - he has not had any  improvement in his AV conduction now 24 hours out from last dose (4 half lives) and he has not tolerated heart block requiring IV Lasix and NTG. We will proceed with insertion of a DDD PM. I have reviewed the indications/risks/benefits/goals/expectations and he wishes to proceed. Acute diastolic heart failure - appears to be due to his heart block. Improved, just taken off of bipap.  HTN heart disease - He will need aggressive treatment with uptitration of he coreg after his PPM insertion.   For questions or updates, please contact [place] Please consult [email or web address] for contact info under Cardiology/STEMI.      Signed, Cristopher Peru, MD  01/07/2022, 8:05 AM

## 2022-01-08 ENCOUNTER — Inpatient Hospital Stay (HOSPITAL_COMMUNITY): Payer: Medicare HMO

## 2022-01-08 DIAGNOSIS — E876 Hypokalemia: Secondary | ICD-10-CM

## 2022-01-08 DIAGNOSIS — I442 Atrioventricular block, complete: Secondary | ICD-10-CM | POA: Diagnosis not present

## 2022-01-08 DIAGNOSIS — I5031 Acute diastolic (congestive) heart failure: Secondary | ICD-10-CM

## 2022-01-08 DIAGNOSIS — N179 Acute kidney failure, unspecified: Secondary | ICD-10-CM

## 2022-01-08 DIAGNOSIS — N189 Chronic kidney disease, unspecified: Secondary | ICD-10-CM

## 2022-01-08 LAB — BASIC METABOLIC PANEL
Anion gap: 7 (ref 5–15)
Anion gap: 8 (ref 5–15)
BUN: 16 mg/dL (ref 8–23)
BUN: 17 mg/dL (ref 8–23)
CO2: 23 mmol/L (ref 22–32)
CO2: 23 mmol/L (ref 22–32)
Calcium: 8.1 mg/dL — ABNORMAL LOW (ref 8.9–10.3)
Calcium: 8.4 mg/dL — ABNORMAL LOW (ref 8.9–10.3)
Chloride: 104 mmol/L (ref 98–111)
Chloride: 100 mmol/L (ref 98–111)
Creatinine, Ser: 1.66 mg/dL — ABNORMAL HIGH (ref 0.61–1.24)
Creatinine, Ser: 1.49 mg/dL — ABNORMAL HIGH (ref 0.61–1.24)
GFR, Estimated: 43 mL/min — ABNORMAL LOW (ref >60)
GFR, Estimated: 49 mL/min — ABNORMAL LOW (ref >60)
Glucose, Bld: 218 mg/dL — ABNORMAL HIGH (ref 70–99)
Glucose, Bld: 163 mg/dL — ABNORMAL HIGH (ref 70–99)
Potassium: 3.5 mmol/L (ref 3.5–5.1)
Potassium: 3.7 mmol/L (ref 3.5–5.1)
Sodium: 134 mmol/L — ABNORMAL LOW (ref 135–145)
Sodium: 131 mmol/L — ABNORMAL LOW (ref 135–145)

## 2022-01-08 LAB — MAGNESIUM: Magnesium: 1.9 mg/dL (ref 1.7–2.4)

## 2022-01-08 MED ORDER — HYDRALAZINE HCL 20 MG/ML IJ SOLN
10.0000 mg | Freq: Three times a day (TID) | INTRAMUSCULAR | Status: AC | PRN
  Administered 2022-01-08 – 2022-01-09 (×3): 10 mg via INTRAVENOUS
  Filled 2022-01-08 (×2): qty 1

## 2022-01-08 MED ORDER — HYDRALAZINE HCL 20 MG/ML IJ SOLN
10.0000 mg | Freq: Once | INTRAMUSCULAR | Status: AC
  Administered 2022-01-08: 10 mg via INTRAVENOUS
  Filled 2022-01-08: qty 1

## 2022-01-08 MED ORDER — POTASSIUM CHLORIDE CRYS ER 20 MEQ PO TBCR
40.0000 meq | EXTENDED_RELEASE_TABLET | Freq: Once | ORAL | Status: AC
  Administered 2022-01-08: 40 meq via ORAL
  Filled 2022-01-08: qty 2

## 2022-01-08 MED ORDER — AMLODIPINE BESYLATE 5 MG PO TABS
5.0000 mg | ORAL_TABLET | Freq: Every day | ORAL | Status: DC
  Administered 2022-01-08 – 2022-01-10 (×3): 5 mg via ORAL
  Filled 2022-01-08 (×3): qty 1

## 2022-01-08 MED ORDER — FUROSEMIDE 10 MG/ML IJ SOLN
60.0000 mg | Freq: Once | INTRAMUSCULAR | Status: AC
  Administered 2022-01-08: 60 mg via INTRAVENOUS
  Filled 2022-01-08: qty 6

## 2022-01-08 NOTE — Progress Notes (Signed)
Progress Note  Patient Name: Sylvestre Rathgeber Date of Encounter: 01/08/2022  Primary Cardiologist: None   Subjective   Feels better. No chest pain or sob.   Inpatient Medications    Scheduled Meds:  amLODipine  5 mg Oral Daily   benazepril  40 mg Oral Q breakfast   carvedilol  12.5 mg Oral BID WC   Chlorhexidine Gluconate Cloth  6 each Topical Daily   glipiZIDE  5 mg Oral BID WC   mupirocin ointment  1 Application Nasal BID   pravastatin  20 mg Oral Q2200   Continuous Infusions:  nitroGLYCERIN 100 mcg/min (01/08/22 0828)   PRN Meds: acetaminophen, ondansetron (ZOFRAN) IV   Vital Signs    Vitals:   01/08/22 0700 01/08/22 0753 01/08/22 0800 01/08/22 0830  BP: (!) 154/81  (!) 163/86 (!) 154/89  Pulse: 76  77 83  Resp: (!) 26  (!) 26 (!) 29  Temp:  98.5 F (36.9 C)    TempSrc:  Oral    SpO2: 98%  98% 99%  Weight:      Height:        Intake/Output Summary (Last 24 hours) at 01/08/2022 0850 Last data filed at 01/08/2022 0600 Gross per 24 hour  Intake 341.06 ml  Output 2300 ml  Net -1958.94 ml   Filed Weights   01/06/22 1950  Weight: 109.8 kg    Telemetry    Nsr with occaisional PVC's and ventricular pacing- Personally Reviewed  ECG    NSR with ventricular pacing - Personally Reviewed  Physical Exam   GEN: No acute distress.   Neck: No JVD Cardiac: RRR, no murmurs, rubs, or gallops.  Respiratory: Clear to auscultation bilaterally. GI: Soft, nontender, non-distended  MS: 2+ edema; No deformity. Neuro:  Nonfocal  Psych: Normal affect   Labs    Chemistry Recent Labs  Lab 01/06/22 1210 01/06/22 1216 01/07/22 0145 01/08/22 0002  NA 138 139 137 134*  K 3.7 3.6 3.4* 3.5  CL 106 104 104 104  CO2 20*  --  22 23  GLUCOSE 207* 202* 117* 218*  BUN '20 21 18 16  '$ CREATININE 1.90* 2.00* 1.73* 1.66*  CALCIUM 8.5*  --  8.3* 8.1*  GFRNONAA 37*  --  41* 43*  ANIONGAP 12  --  11 7     Hematology Recent Labs  Lab 01/06/22 1210 01/06/22 1216  WBC  5.2  --   RBC 4.45  --   HGB 14.6 13.6  HCT 40.9 40.0  MCV 91.9  --   MCH 32.8  --   MCHC 35.7  --   RDW 13.8  --   PLT 178  --     Cardiac EnzymesNo results for input(s): "TROPONINI" in the last 168 hours. No results for input(s): "TROPIPOC" in the last 168 hours.   BNPNo results for input(s): "BNP", "PROBNP" in the last 168 hours.   DDimer No results for input(s): "DDIMER" in the last 168 hours.   Radiology    EP PPM/ICD IMPLANT  Result Date: 01/07/2022 CONCLUSIONS:  1. Successful implantation of a Biotronik dual-chamber pacemaker for symptomatic bradycardia due to complete heart block  2. No early apparent complications.       Cristopher Peru, MD 01/07/2022 11:12 AM  DG Chest Portable 1 View  Result Date: 01/06/2022 CLINICAL DATA:  Shortness of breath.  Chest pain. EXAM: PORTABLE CHEST 1 VIEW COMPARISON:  Two-view chest x-ray 11/07/2013 FINDINGS: The heart size exaggerated by low lung volumes. Atherosclerotic changes are  present in the aortic arch. Asymmetric airspace disease is present in the right lower lobe. Mild pulmonary vascular congestion is present. No other significant airspace consolidation is present. IMPRESSION: 1. Asymmetric airspace disease in the right lower lobe concerning for pneumonia. 2. Mild pulmonary vascular congestion. 3. Atherosclerosis. Electronically Signed   By: San Morelle M.D.   On: 01/06/2022 15:30   ECHOCARDIOGRAM COMPLETE  Result Date: 01/06/2022    ECHOCARDIOGRAM REPORT   Patient Name:   DAMYN WEITZEL Date of Exam: 01/06/2022 Medical Rec #:  [id number]       Height:       71.0 in Accession #:    [id number]      Weight:       228.0 lb Date of Birth:  [birth year]       BSA:          2.229 m Patient Age:    71 years        BP:           161/58 mmHg Patient Gender: M               HR:           32 bpm. Exam Location:  Inpatient Procedure: 2D Echo, Color Doppler and Cardiac Doppler STAT ECHO Indications:    I44.2 Complete heart block  History:         Patient has no prior history of Echocardiogram examinations.                 Risk Factors:Hypertension and Diabetes.  Sonographer:    Raquel Sarna Senior RDCS Referring Phys: [id number] [place]  1. Left ventricular ejection fraction, by estimation, is 60 to 65%. The left ventricle has normal function. The left ventricle has no regional wall motion abnormalities. Left ventricular diastolic parameters are indeterminate.  2. Right ventricular systolic function is normal. The right ventricular size is normal. There is severely elevated pulmonary artery systolic pressure.  3. Left atrial size was moderately dilated.  4. The mitral valve is abnormal. Mild mitral valve regurgitation. No evidence of mitral stenosis.  5. Tricuspid valve regurgitation is mild to moderate.  6. The aortic valve is tricuspid. There is mild calcification of the aortic valve. Aortic valve regurgitation is not visualized. Aortic valve sclerosis is present, with no evidence of aortic valve stenosis.  7. The inferior vena cava is normal in size with greater than 50% respiratory variability, suggesting right atrial pressure of 3 mmHg. FINDINGS  Left Ventricle: Left ventricular ejection fraction, by estimation, is 60 to 65%. The left ventricle has normal function. The left ventricle has no regional wall motion abnormalities. The left ventricular internal cavity size was normal in size. There is  no left ventricular hypertrophy. Left ventricular diastolic parameters are indeterminate. Right Ventricle: The right ventricular size is normal. No increase in right ventricular wall thickness. Right ventricular systolic function is normal. There is severely elevated pulmonary artery systolic pressure. The tricuspid regurgitant velocity is 3.60 m/s, and with an assumed right atrial pressure of 15 mmHg, the estimated right ventricular systolic pressure is 48.5 mmHg. Left Atrium: Left atrial size was moderately dilated. Right Atrium: Right atrial  size was normal in size. Pericardium: There is no evidence of pericardial effusion. Mitral Valve: The mitral valve is abnormal. There is mild thickening of the mitral valve leaflet(s). There is mild calcification of the mitral valve leaflet(s). Mild mitral annular calcification. Mild mitral valve regurgitation. No evidence of mitral valve stenosis. Tricuspid Valve:  The tricuspid valve is normal in structure. Tricuspid valve regurgitation is mild to moderate. No evidence of tricuspid stenosis. Aortic Valve: The aortic valve is tricuspid. There is mild calcification of the aortic valve. Aortic valve regurgitation is not visualized. Aortic valve sclerosis is present, with no evidence of aortic valve stenosis. Pulmonic Valve: The pulmonic valve was normal in structure. Pulmonic valve regurgitation is not visualized. No evidence of pulmonic stenosis. Aorta: The aortic root is normal in size and structure. Venous: The inferior vena cava is normal in size with greater than 50% respiratory variability, suggesting right atrial pressure of 3 mmHg. IAS/Shunts: No atrial level shunt detected by color flow Doppler.  LEFT VENTRICLE PLAX 2D LVIDd:         5.20 cm LVIDs:         2.80 cm LV PW:         0.90 cm LV IVS:        1.00 cm LVOT diam:     2.20 cm LV SV:         118 LV SV Index:   53 LVOT Area:     3.80 cm  RIGHT VENTRICLE TAPSE (M-mode): 2.2 cm LEFT ATRIUM             Index        RIGHT ATRIUM           Index LA diam:        4.30 cm 1.93 cm/m   RA Area:     16.70 cm LA Vol (A2C):   58.0 ml 26.02 ml/m  RA Volume:   50.40 ml  22.61 ml/m LA Vol (A4C):   57.8 ml 25.93 ml/m LA Biplane Vol: 59.7 ml 26.78 ml/m  AORTIC VALVE LVOT Vmax:   139.00 cm/s LVOT Vmean:  84.200 cm/s LVOT VTI:    0.310 m  AORTA Ao Root diam: 3.30 cm TRICUSPID VALVE TR Peak grad:   51.8 mmHg TR Vmax:        360.00 cm/s  SHUNTS Systemic VTI:  0.31 m Systemic Diam: 2.20 cm Jenkins Rouge MD Electronically signed by Jenkins Rouge MD Signature Date/Time:  01/06/2022/1:25:32 PM    Final     Cardiac Studies   2D echo as above  Patient Profile     71 y.o. male admitted with CHB and diastolic heart failure  Assessment & Plan    CHB - he is much improved after PPM insertion. No escape today. PPM -his Biotronik DDD PM is working normally. Interrogation demonstrates normal DDD PM function. Acute diastolic heart failure - he is weaning off of IV ntg. We will continue IV lasix and consider Entresto.  Hypokalemia - we will replete. Acute on chronic renal failure - his creatinine is now improving with pacing and increased cardiac output.   For questions or updates, please contact [place] Please consult [email or web address] for contact info under Cardiology/STEMI.   Signed, Cristopher Peru, MD  01/08/2022, 8:50 AM

## 2022-01-08 NOTE — Progress Notes (Signed)
Pt has PRN Bipap orders, no distress noted at this time.  

## 2022-01-09 ENCOUNTER — Encounter (HOSPITAL_COMMUNITY): Payer: Self-pay | Admitting: Internal Medicine

## 2022-01-09 DIAGNOSIS — E876 Hypokalemia: Secondary | ICD-10-CM | POA: Diagnosis not present

## 2022-01-09 DIAGNOSIS — N179 Acute kidney failure, unspecified: Secondary | ICD-10-CM | POA: Diagnosis not present

## 2022-01-09 DIAGNOSIS — N189 Chronic kidney disease, unspecified: Secondary | ICD-10-CM | POA: Diagnosis not present

## 2022-01-09 DIAGNOSIS — I5031 Acute diastolic (congestive) heart failure: Secondary | ICD-10-CM | POA: Diagnosis not present

## 2022-01-09 LAB — BASIC METABOLIC PANEL
Anion gap: 8 (ref 5–15)
BUN: 16 mg/dL (ref 8–23)
CO2: 23 mmol/L (ref 22–32)
Calcium: 8.4 mg/dL — ABNORMAL LOW (ref 8.9–10.3)
Chloride: 103 mmol/L (ref 98–111)
Creatinine, Ser: 1.39 mg/dL — ABNORMAL HIGH (ref 0.61–1.24)
GFR, Estimated: 54 mL/min — ABNORMAL LOW (ref >60)
Glucose, Bld: 138 mg/dL — ABNORMAL HIGH (ref 70–99)
Potassium: 3.8 mmol/L (ref 3.5–5.1)
Sodium: 134 mmol/L — ABNORMAL LOW (ref 135–145)

## 2022-01-09 MED ORDER — MAGNESIUM GLUCONATE 500 MG PO TABS
500.0000 mg | ORAL_TABLET | Freq: Once | ORAL | Status: AC
  Administered 2022-01-09: 500 mg via ORAL
  Filled 2022-01-09: qty 1

## 2022-01-09 MED ORDER — CARVEDILOL 25 MG PO TABS
25.0000 mg | ORAL_TABLET | Freq: Two times a day (BID) | ORAL | Status: DC
  Administered 2022-01-09 – 2022-01-10 (×3): 25 mg via ORAL
  Filled 2022-01-09 (×3): qty 1

## 2022-01-09 MED ORDER — POTASSIUM CHLORIDE 10 MEQ/100ML IV SOLN
10.0000 meq | INTRAVENOUS | Status: AC
  Administered 2022-01-09 (×2): 10 meq via INTRAVENOUS
  Filled 2022-01-09 (×2): qty 100

## 2022-01-09 MED ORDER — FUROSEMIDE 10 MG/ML IJ SOLN
80.0000 mg | Freq: Once | INTRAMUSCULAR | Status: AC
  Administered 2022-01-09: 80 mg via INTRAVENOUS
  Filled 2022-01-09: qty 8

## 2022-01-09 MED ORDER — POTASSIUM CHLORIDE CRYS ER 20 MEQ PO TBCR
40.0000 meq | EXTENDED_RELEASE_TABLET | Freq: Once | ORAL | Status: AC
  Administered 2022-01-09: 40 meq via ORAL
  Filled 2022-01-09: qty 2

## 2022-01-09 MED FILL — Gentamicin Sulfate Inj 40 MG/ML: INTRAMUSCULAR | Qty: 80 | Status: AC

## 2022-01-09 MED FILL — Hydralazine HCl Inj 20 MG/ML: INTRAMUSCULAR | Qty: 1 | Status: AC

## 2022-01-09 NOTE — Discharge Instructions (Signed)
    Supplemental Discharge Instructions for  Pacemaker/Defibrillator Patients   Activity No heavy lifting or vigorous activity with your left/right arm for 6 to 8 weeks.  Do not raise your left/right arm above your head for one week.  Gradually raise your affected arm as drawn below.             01/12/22                    01/13/22                     01/14/22                 01/15/22 __  NO DRIVING for  1 week   ; you may begin driving on   03/10/58  .  WOUND CARE Keep the wound area clean and dry.  Do not get this area wet , no showers for one week; you may shower on  01/15/22   . The tape/steri-strips on your wound will fall off; do not pull them off.  No bandage is needed on the site.  DO  NOT apply any creams, oils, or ointments to the wound area. If you notice any drainage or discharge from the wound, any swelling or bruising at the site, or you develop a fever > 101? F after you are discharged home, call the office at once.  Special Instructions You are still able to use cellular telephones; use the ear opposite the side where you have your pacemaker/defibrillator.  Avoid carrying your cellular phone near your device. When traveling through airports, show security personnel your identification card to avoid being screened in the metal detectors.  Ask the security personnel to use the hand wand. Avoid arc welding equipment, MRI testing (magnetic resonance imaging), TENS units (transcutaneous nerve stimulators).  Call the office for questions about other devices. Avoid electrical appliances that are in poor condition or are not properly grounded. Microwave ovens are safe to be near or to operate.  WITH A DEFIBRILLATOR TO THE HOSPITAL--CALL 911.

## 2022-01-09 NOTE — Progress Notes (Addendum)
Progress Note  Patient Name: Arthur Carr Date of Encounter: 01/09/2022  Primary Cardiologist: None   Subjective   Feels oK a little achy at pacer site  Inpatient Medications    Scheduled Meds:  amLODipine  5 mg Oral Daily   benazepril  40 mg Oral Q breakfast   carvedilol  12.5 mg Oral BID WC   Chlorhexidine Gluconate Cloth  6 each Topical Daily   glipiZIDE  5 mg Oral BID WC   mupirocin ointment  1 Application Nasal BID   pravastatin  20 mg Oral Q2200   Continuous Infusions:  nitroGLYCERIN 100 mcg/min (01/09/22 0600)   PRN Meds: acetaminophen, hydrALAZINE, ondansetron (ZOFRAN) IV   Vital Signs    Vitals:   01/09/22 0400 01/09/22 0500 01/09/22 0600 01/09/22 0700  BP: (!) 175/84 (!) 188/80 (!) 170/82 (!) 169/82  Pulse: 79 81 68 84  Resp: (!) 25 (!) 24 (!) 23 (!) 29  Temp:      TempSrc:      SpO2: 99% 98% 99% 98%  Weight:      Height:        Intake/Output Summary (Last 24 hours) at 01/09/2022 0739 Last data filed at 01/09/2022 0600 Gross per 24 hour  Intake 613.45 ml  Output 2200 ml  Net -1586.55 ml   Filed Weights   01/06/22 1950  Weight: 109.8 kg    Telemetry    Nsr with occasional, at times frequent, PVC's and ventricular pacing- Personally Reviewed  ECG   No new EKGs - Personally Reviewed  Physical Exam   GEN: No acute distress.   Neck: No JVD Cardiac: RRR, no murmurs, rubs, or gallops.  Respiratory: soft crackles at the bases L>R. GI: Soft, nontender, non-distended  MS: 2+ edema; No deformity. Neuro:  Nonfocal  Psych: Normal affect   Labs    Chemistry Recent Labs  Lab 01/08/22 0002 01/08/22 2130 01/09/22 0025  NA 134* 131* 134*  K 3.5 3.7 3.8  CL 104 100 103  CO2 '23 23 23  '$ GLUCOSE 218* 163* 138*  BUN '16 17 16  '$ CREATININE 1.66* 1.49* 1.39*  CALCIUM 8.1* 8.4* 8.4*  GFRNONAA 43* 49* 54*  ANIONGAP '7 8 8     '$ Hematology Recent Labs  Lab 01/06/22 1210 01/06/22 1216  WBC 5.2  --   RBC 4.45  --   HGB 14.6 13.6  HCT 40.9  40.0  MCV 91.9  --   MCH 32.8  --   MCHC 35.7  --   RDW 13.8  --   PLT 178  --     Cardiac EnzymesNo results for input(s): "TROPONINI" in the last 168 hours. No results for input(s): "TROPIPOC" in the last 168 hours.   BNPNo results for input(s): "BNP", "PROBNP" in the last 168 hours.   DDimer No results for input(s): "DDIMER" in the last 168 hours.   Radiology    DG Chest 2 View  Result Date: 01/08/2022 CLINICAL DATA:  Status post pacemaker placement. EXAM: CHEST - 2 VIEW COMPARISON:  01/06/2022 FINDINGS: A new dual lead pacemaker is seen in appropriate position. No evidence of pneumothorax. Low lung volumes are noted, with elevation of left hemidiaphragm. Mild bibasilar atelectasis is seen. No evidence of pulmonary consolidation or pleural effusion. Stable mild cardiomegaly. Aortic atherosclerotic calcification incidentally noted. IMPRESSION: New pacemaker in appropriate position. No evidence of pneumothorax. Low lung volumes with mild bibasilar atelectasis. Electronically Signed   By: Marlaine Hind M.D.   On: 01/08/2022 15:21  EP PPM/ICD IMPLANT  Result Date: 01/07/2022 CONCLUSIONS:  1. Successful implantation of a Biotronik dual-chamber pacemaker for symptomatic bradycardia due to complete heart block  2. No early apparent complications.       Cristopher Peru, MD 01/07/2022 11:12 AM   Cardiac Studies    01/06/22: TTE 1. Left ventricular ejection fraction, by estimation, is 60 to 65%. The  left ventricle has normal function. The left ventricle has no regional  wall motion abnormalities. Left ventricular diastolic parameters are  indeterminate.   2. Right ventricular systolic function is normal. The right ventricular  size is normal. There is severely elevated pulmonary artery systolic  pressure.   3. Left atrial size was moderately dilated.   4. The mitral valve is abnormal. Mild mitral valve regurgitation. No  evidence of mitral stenosis.   5. Tricuspid valve regurgitation is  mild to moderate.   6. The aortic valve is tricuspid. There is mild calcification of the  aortic valve. Aortic valve regurgitation is not visualized. Aortic valve  sclerosis is present, with no evidence of aortic valve stenosis.   7. The inferior vena cava is normal in size with greater than 50%  respiratory variability, suggesting right atrial pressure of 3 mmHg.   Patient Profile     71 y.o. male admitted with CHB and diastolic heart failure  Assessment & Plan    CHB  S/p PPM implanted 01/07/22, Dr. Lynwood Dawley Site is stable.  Acute diastolic heart failure  Fluid neg cumulatively 4240m No weights Crackles at the bases, + edema yet Lasix again today  HTN ooc Still on NTG gtt  On amlodipine '5mg'$ , benazepril '40mg'$  and coreg 12.5  Prn hydralazine Increase coreg with PVCs noted as well.  Hypokalemia  Better Will replace further with more IV lasix planned  Acute on chronic renal failure improving  For questions or updates, please [facility] Please consult [email or web address] for contact info under Cardiology/STEMI.   Signed, RBaldwin Jamaica PA-C  01/09/2022, 7:39 AM     EP Attending  Patient seen and examined. Agree with the findings as noted above. He remains somewhat volume overloaded and we will continue with IV lasix. He will be transferred to tele. His bp has been difficult to control we will uptitrate his coreg.  GCarleene OverlieTaylor,MD

## 2022-01-09 NOTE — Care Management (Signed)
  Transition of Care [facility]) Screening Note   Patient Details  Name: Arthur Carr Date of Birth: [birth year]   Transition of Care [facility]) CM/SW Contact:    Bethena Roys, RN Phone Number: 01/09/2022, 4:47 PM    Transition of Care Department [facility]) has reviewed the patient and no TOC needs have been identified at this time. We will continue to monitor patient advancement through interdisciplinary progression rounds. If new patient transition needs arise, please place a TOC consult.

## 2022-01-10 ENCOUNTER — Other Ambulatory Visit (HOSPITAL_COMMUNITY): Payer: Self-pay

## 2022-01-10 LAB — BASIC METABOLIC PANEL
Anion gap: 8 (ref 5–15)
BUN: 15 mg/dL (ref 8–23)
CO2: 23 mmol/L (ref 22–32)
Calcium: 8.6 mg/dL — ABNORMAL LOW (ref 8.9–10.3)
Chloride: 103 mmol/L (ref 98–111)
Creatinine, Ser: 1.33 mg/dL — ABNORMAL HIGH (ref 0.61–1.24)
GFR, Estimated: 56 mL/min — ABNORMAL LOW (ref >60)
Glucose, Bld: 106 mg/dL — ABNORMAL HIGH (ref 70–99)
Potassium: 4 mmol/L (ref 3.5–5.1)
Sodium: 134 mmol/L — ABNORMAL LOW (ref 135–145)

## 2022-01-10 LAB — GLUCOSE, CAPILLARY: Glucose-Capillary: 152 mg/dL — ABNORMAL HIGH (ref 70–99)

## 2022-01-10 MED ORDER — SACUBITRIL-VALSARTAN 24-26 MG PO TABS: 1.0000 | ORAL_TABLET | Freq: Two times a day (BID) | ORAL | Status: DC

## 2022-01-10 MED ORDER — BUMETANIDE 1 MG PO TABS
1.0000 mg | ORAL_TABLET | Freq: Two times a day (BID) | ORAL | Status: DC
Start: 2022-01-10 — End: 2022-01-10
  Administered 2022-01-10: 1 mg via ORAL
  Filled 2022-01-10 (×2): qty 1

## 2022-01-10 MED ORDER — BUMETANIDE 1 MG PO TABS
1.0000 mg | ORAL_TABLET | Freq: Two times a day (BID) | ORAL | 5 refills | Status: DC
  Filled 2022-01-10: qty 60, 30d supply, fill #0

## 2022-01-10 MED ORDER — HYDRALAZINE HCL 50 MG PO TABS
50.0000 mg | ORAL_TABLET | Freq: Three times a day (TID) | ORAL | 5 refills | Status: DC
  Filled 2022-01-10: qty 90, 30d supply, fill #0

## 2022-01-10 MED ORDER — SACUBITRIL-VALSARTAN 24-26 MG PO TABS
1.0000 | ORAL_TABLET | Freq: Two times a day (BID) | ORAL | 5 refills | Status: DC
  Filled 2022-01-10: qty 60, 30d supply, fill #0

## 2022-01-10 MED ORDER — HYDRALAZINE HCL 50 MG PO TABS
50.0000 mg | ORAL_TABLET | Freq: Three times a day (TID) | ORAL | Status: DC
Start: 2022-01-10 — End: 2022-01-10
  Administered 2022-01-10: 50 mg via ORAL
  Filled 2022-01-10: qty 1

## 2022-01-10 MED ORDER — CARVEDILOL 25 MG PO TABS
25.0000 mg | ORAL_TABLET | Freq: Two times a day (BID) | ORAL | 5 refills | Status: DC
  Filled 2022-01-10: qty 60, 30d supply, fill #0

## 2022-01-10 NOTE — Plan of Care (Signed)

## 2022-01-10 NOTE — Evaluation (Signed)
Physical Therapy Evaluation Patient Details Name: Arthur Carr MRN: [id number] DOB: [birth year] Today's Date: 01/10/2022  History of Present Illness  pt is a 71 y/o male admitted 6/16 with DOE and fatigue x1-2 weeks, work up found CHB, pt s/p PPM insertion 6/18.  PMHx: DM2, CKD2, HLD, HNT, Prostate CA, R TKA  Clinical Impression  Pt is at or close to baseline functioning and should be safe at home with wife/family assist. There are no further acute PT needs.  Will sign off at this time.        Recommendations for follow up therapy are one component of a multi-disciplinary discharge planning process, led by the attending physician.  Recommendations may be updated based on patient status, additional functional criteria and insurance authorization.  Follow Up Recommendations No PT follow up    Assistance Recommended at Discharge Intermittent Supervision/Assistance  Patient can return home with the following  A little help with bathing/dressing/bathroom;Assist for transportation;Assistance with cooking/housework    Equipment Recommendations None recommended by PT  Recommendations for Other Services       Functional Status Assessment Patient has had a recent decline in their functional status and demonstrates the ability to make significant improvements in function in a reasonable and predictable amount of time.     Precautions / Restrictions Precautions Precautions: ICD/Pacemaker      Mobility  Bed Mobility               General bed mobility comments: OOB in arrival    Transfers Overall transfer level: Needs assistance   Transfers: Sit to/from Stand Sit to Stand: Min guard           General transfer comment: cued to minimize use of L UE temporarily, stiff LE's made sit to stand from lower surface a mild challenge.    Ambulation/Gait Ambulation/Gait assistance: Min guard Gait Distance (Feet): 300 Feet Assistive device: IV Pole Gait Pattern/deviations:  Step-through pattern   Gait velocity interpretation: <1.8 ft/sec, indicate of risk for recurrent falls   General Gait Details: mildly antalgic, but generally steady with IV pole simulating offloading of a cane.  Stairs Stairs: Yes   Stair Management: One rail Right, Step to pattern, Forwards Number of Stairs: 3 General stair comments: safe with the rail  Wheelchair Mobility    Modified Rankin (Stroke Patients Only)       Balance Overall balance assessment: No apparent balance deficits (not formally assessed)                                           Pertinent Vitals/Pain Pain Assessment Pain Assessment: Faces Faces Pain Scale: No hurt Pain Intervention(s): Monitored during session    Home Living Family/patient expects to be discharged to:: Private residence Living Arrangements: Spouse/significant other Available Help at Discharge: Family;Available 24 hours/day Type of Home: House Home Access: Stairs to enter Entrance Stairs-Rails: Psychiatric nurse of Steps: 6   Home Layout: One level Home Equipment: Cane - single point;Grab bars - tub/shower      Prior Function Prior Level of Function : Independent/Modified Independent;Driving                     Hand Dominance        Extremity/Trunk Assessment   Upper Extremity Assessment Upper Extremity Assessment: Overall WFL for tasks assessed    Lower Extremity Assessment Lower Extremity Assessment: Overall [facility]  for tasks assessed (bil edema L>R and more chronic L than R)    Cervical / Trunk Assessment Cervical / Trunk Assessment: Normal  Communication   Communication: No difficulties  Cognition Arousal/Alertness: Awake/alert Behavior During Therapy: WFL for tasks assessed/performed Overall Cognitive Status: Within Functional Limits for tasks assessed                                          General Comments General comments (skin integrity, edema,  etc.): VSS on RA with 1-2drops to 87% SpO2, generally 89-90% low with quick return to 93%,  HR in the lower to mid 80's BPM during activity.    Exercises     Assessment/Plan    PT Assessment Patient does not need any further PT services  PT Problem List         PT Treatment Interventions      PT Goals (Current goals can be found in the Care Plan section)  Acute Rehab PT Goals PT Goal Formulation: All assessment and education complete, DC therapy    Frequency       Co-evaluation               AM-PAC PT "6 Clicks" Mobility  Outcome Measure Help needed turning from your back to your side while in a flat bed without using bedrails?: A Little Help needed moving from lying on your back to sitting on the side of a flat bed without using bedrails?: A Little Help needed moving to and from a bed to a chair (including a wheelchair)?: A Little Help needed standing up from a chair using your arms (e.g., wheelchair or bedside chair)?: A Little Help needed to walk in hospital room?: A Little Help needed climbing 3-5 steps with a railing? : A Little 6 Click Score: 18    End of Session   Activity Tolerance: Patient tolerated treatment well Patient left: in chair;with call bell/phone within reach;with family/visitor present Nurse Communication: Mobility status PT Visit Diagnosis: Other abnormalities of gait and mobility (R26.89)    Time: 4081-4481 PT Time Calculation (min) (ACUTE ONLY): 26 min   Charges:   PT Evaluation $PT Eval Low Complexity: 1 Low PT Treatments $Gait Training: 8-22 mins        01/10/2022  Ginger Carne., PT Acute Rehabilitation Services [phone]  (pager) [phone]  (office)  Tessie Fass Wayman Hoard 01/10/2022, 12:33 PM

## 2022-01-10 NOTE — Plan of Care (Signed)
Patient and wife were explained and given discharge orders. IV was discontinued. Patient without questions and appropriate for discharge.

## 2022-01-10 NOTE — TOC Benefit Eligibility Note (Signed)
Patient Teacher, English as a foreign language completed.    The patient is currently admitted and upon discharge could be taking Entresto 24-26 mg.  The current 30 day co-pay is, $47.00.   The patient is currently admitted and upon discharge could be taking Farxiga 10 mg.  The current 30 day co-pay is, $47.00.   The patient is currently admitted and upon discharge could be taking Jardiance 10 mg.  The current 30 day co-pay is, $47.00.   The patient is insured through [place], [place] Patient Advocate Specialist [place] Patient Advocate Team Direct Number: [phone]  Fax: [phone]

## 2022-01-10 NOTE — Discharge Summary (Addendum)
ELECTROPHYSIOLOGY PROCEDURE DISCHARGE SUMMARY    Patient ID: Arthur Carr,  MRN: [id number], DOB/AGE: 12-09-1950 71 y.o.  Admit date: 01/06/2022 Discharge date: 01/10/2022  Primary Care Physician: Ginger Organ., MD  Primary Cardiologist: none EP: new, Dr. Lovena Le  Primary Discharge Diagnosis:  CHB HTN urgency Acute CHF  Secondary Discharge Diagnosis:  DM CKD (II) HLD  No Known Allergies   Procedures This Admission:  1.  Implantation of a Biotronik dual chamber PPM on 01/06/22 by Dr Lovena Le See procedure report for full details There were no immediate post procedure complications.   Brief HPI: Arthur Carr is a 71 y.o. male was referred to the ER via his PMD where he went with c/o a week of fatigue, progressive weakness, found in CHB. Initially quite stable, though developed progressive and significant SOB in the ER increasing BPs and developed likely flash pulmonary edema and admitted to ICU  Hospital Course:  The patient was treated with NTG gtt, BIPAP and diuretics, labs monitored closely with AKI as well.  Labs and respiratory status has steady improvement and he underwent implantation of a PPM 01/07/22, without immediate complications.  TTE with LVEF 60-65%.  Post implant BP meds advanced and he was further diuresed.  Able to get off O2.  NTG gtt kept for additional BP control, though off day of discharge with improved BPs on oral medicines alone He reports feeling well, being OOB and left chest was without hematoma or ecchymosis.  The device was interrogated POD #1 and found to be functioning normally.  CXR also done POD #1 was obtained and demonstrated no pneumothorax status post device implantation.  Wound care, arm mobility, and restrictions were reviewed with the patient.  Dr. Lovena Le discussed at length with the patient need for regular out patient follow up  and better BP control.The patient feels well, denies any CP/SOB, with minimal if any site  discomfort.  He was examined by Dr. Lovena Le and considered stable for discharge to home.   Will stop his benazepril and start Entresto tomorrow evening  Add Hydralazine Change his home lasix to bumex Increase his home coreg  Early EP APP follow up is in place   Physical Exam: Vitals:   01/10/22 0800 01/10/22 0900 01/10/22 1000 01/10/22 1100  BP: (!) 153/72 (!) 150/64 (!) 156/73 (!) 149/81  Pulse: 73 78 81 81  Resp: (!) 28 (!) 22 (!) 26 (!) 22  Temp: 98.8 F (37.1 C)     TempSrc: Oral     SpO2: 94% 95% 95% 96%  Weight:      Height:        GEN- The patient is well appearing, alert and oriented x 3 today.   HEENT: normocephalic, atraumatic; sclera clear, conjunctiva pink; hearing intact; oropharynx clear; neck supple, no JVP Lungs- CTA b/l, normal work of breathing.  No wheezes, rales, rhonchi Heart- RRR, no murmurs, rubs or gallops, PMI not laterally displaced GI- soft, non-tender, non-distended Extremities- no clubbing, cyanosis, or edema MS- no significant deformity or atrophy Skin- warm and dry, no rash or lesion, left chest without hematoma/ecchymosis Psych- euthymic mood, full affect Neuro- no gross deficits   Labs:   Lab Results  Component Value Date   WBC 5.2 01/06/2022   HGB 13.6 01/06/2022   HCT 40.0 01/06/2022   MCV 91.9 01/06/2022   PLT 178 01/06/2022    Recent Labs  Lab 01/10/22 0549  NA 134*  K 4.0  CL 103  CO2 23  BUN 15  CREATININE 1.33*  CALCIUM 8.6*  GLUCOSE 106*    Discharge Medications:  Allergies as of 01/10/2022   No Known Allergies      Medication List     STOP taking these medications    benazepril 40 MG tablet Commonly known as: LOTENSIN   furosemide 80 MG tablet Commonly known as: LASIX       TAKE these medications    acetaminophen 500 MG tablet Commonly known as: TYLENOL Take 1,000 mg by mouth every 6 (six) hours as needed for mild pain.   amLODipine 5 MG tablet Commonly known as: NORVASC Take 5 mg by mouth  daily.   aspirin EC 81 MG tablet Take 81 mg by mouth 2 (two) times a week. Swallow whole.   bumetanide 1 MG tablet Commonly known as: BUMEX Take 1 tablet (1 mg total) by mouth 2 (two) times daily.   carvedilol 25 MG tablet Commonly known as: COREG Take 1 tablet (25 mg total) by mouth 2 (two) times daily with a meal. What changed:  medication strength how much to take when to take this   glipiZIDE 5 MG 24 hr tablet Commonly known as: GLUCOTROL XL Take 5 mg by mouth 2 (two) times daily.   hydrALAZINE 50 MG tablet Commonly known as: APRESOLINE Take 1 tablet (50 mg total) by mouth every 8 (eight) hours.   ibuprofen 200 MG tablet Commonly known as: ADVIL Take 1 tablet (200 mg total) by mouth 2 (two) times daily as needed for fever. What changed: reasons to take this   metFORMIN 500 MG tablet Commonly known as: GLUCOPHAGE Take 500 mg by mouth 2 (two) times daily with a meal.   Ozempic (1 MG/DOSE) 4 MG/3ML Sopn Generic drug: Semaglutide (1 MG/DOSE) Inject 1 mg into the skin once a week. Thursday   pravastatin 20 MG tablet Commonly known as: PRAVACHOL Take 20 mg by mouth daily.   sacubitril-valsartan 24-26 MG Commonly known as: ENTRESTO Take 1 tablet by mouth 2 (two) times daily. FIRST dose 01/11/22 EVENING dose Start taking on: January 12, 2022               Discharge Care Instructions  (From admission, onward)           Start     Ordered   01/10/22 0000  Discharge wound care:       Comments: As per AVS   01/10/22 1229            Disposition: Home Discharge Instructions     Diet - low sodium heart healthy   Complete by: As directed    Discharge wound care:   Complete by: As directed    As per AVS   Increase activity slowly   Complete by: As directed         Duration of Discharge Encounter: Greater than 30 minutes including physician time.  Venetia Night, PA-C 01/10/2022 12:29 PM  EP Attending  Patient seen and examined. He is  stable this morning and bp is improved. He can be discharged home with usual followup. See med list above.  Carleene Overlie Namiah Dunnavant,MD

## 2022-01-19 ENCOUNTER — Ambulatory Visit: Payer: Medicare HMO | Admitting: Physician Assistant

## 2022-01-19 ENCOUNTER — Ambulatory Visit: Payer: Medicare HMO

## 2022-01-19 ENCOUNTER — Encounter: Payer: Self-pay | Admitting: Physician Assistant

## 2022-01-19 VITALS — BP 156/74 | HR 71 | Ht 70.0 in | Wt 233.0 lb

## 2022-01-19 DIAGNOSIS — I5032 Chronic diastolic (congestive) heart failure: Secondary | ICD-10-CM

## 2022-01-19 DIAGNOSIS — Z5189 Encounter for other specified aftercare: Secondary | ICD-10-CM | POA: Diagnosis not present

## 2022-01-19 DIAGNOSIS — Z950 Presence of cardiac pacemaker: Secondary | ICD-10-CM

## 2022-01-19 DIAGNOSIS — I10 Essential (primary) hypertension: Secondary | ICD-10-CM | POA: Diagnosis not present

## 2022-01-19 LAB — CUP PACEART INCLINIC DEVICE CHECK
Date Time Interrogation Session: 20230629163905
Implantable Lead Implant Date: 20230617
Implantable Lead Implant Date: 20230617
Implantable Lead Location: 753859
Implantable Lead Location: 753860
Implantable Lead Model: 377
Implantable Lead Model: 377
Implantable Lead Serial Number: 8000835361
Implantable Lead Serial Number: 8000925809
Implantable Pulse Generator Implant Date: 20230617
Lead Channel Pacing Threshold Amplitude: 1.1 V
Lead Channel Pacing Threshold Amplitude: 1.1 V
Lead Channel Pacing Threshold Pulse Width: 0.4 ms
Lead Channel Pacing Threshold Pulse Width: 0.4 ms
Lead Channel Sensing Intrinsic Amplitude: 12 mV
Lead Channel Sensing Intrinsic Amplitude: 3.6 mV
Pulse Gen Model: 407145
Pulse Gen Serial Number: 70432387

## 2022-01-19 NOTE — Patient Instructions (Signed)
Medication Instructions:  Your physician recommends that you continue on your current medications as directed. Please refer to the Current Medication list given to you today.  *If you need a refill on your cardiac medications before your next appointment, please call your pharmacy*   Lab Work: TODAY: BMET  If you have labs (blood work) drawn today and your tests are completely normal, you will receive your results only by: [place] (if you have MyChart) OR A paper copy in the mail If you have any lab test that is abnormal or we need to change your treatment, we will call you to review the results.    Follow-Up: At [facility], you and your health needs are our priority.  As part of our continuing mission to provide you with exceptional heart care, we have created designated Provider Care Teams.  These Care Teams include your primary Cardiologist (physician) and Advanced Practice Providers (APPs -  Physician Assistants and Nurse Practitioners) who all work together to provide you with the care you need, when you need it.  We recommend signing up for the patient portal called "MyChart".  Sign up information is provided on this After Visit Summary.  MyChart is used to connect with patients for Virtual Visits (Telemedicine).  Patients are able to view lab/test results, encounter notes, upcoming appointments, etc.  Non-urgent messages can be sent to your provider as well.   To learn more about what you can do with MyChart, go to [email or web address].    Your next appointment:   As scheduled

## 2022-01-19 NOTE — Progress Notes (Signed)
Cardiology Office Note Date:  01/19/2022  Patient ID:  Arthur, Carr [birth year], MRN [id number] PCP:  Ginger Organ., MD  Electrophysiologist: Dr. Lovena Le    Chief Complaint:  wound check  History of Present Illness: Arthur Carr is a 71 y.o. male with history of HTN (poorly controlled), DM, CKD (II), HLD  Admitted 01/06/22 with progressive fatigue found with CHB by his PMD and sent to the hospital, initially stable developed progressive SOB, flash edema and HTN emergency requiring NTG, aggressive diuresis and ICU management, home coreg held Underwent PPM implant the following day, BP meds titrated, diuresed well, and discharged 01/10/22 with SBPs 140's-150's His ACE transitioned to Entresto, lasix > Bumex and other meds titrated.  TODAY He feels well Home BPs are similar to today, some a bit better No CP, palpitations or SOB No dizziness, near syncope or syncope. NO wound or pacer concerns    Device information Biotronik dual chamber PPM implanted 01/06/22   Past Medical History:  Diagnosis Date   Arthritis    Diabetes mellitus without complication ([place])    Hypertension    Prostate cancer [facility])     Past Surgical History:  Procedure Laterality Date   JOINT REPLACEMENT  2007   RT TOTAL HIP   LYMPHADENECTOMY Bilateral 08/06/2013   Procedure: LYMPHADENECTOMY AND INDOCYANINE GREEN DYE  INJECTION;  Surgeon: Alexis Frock, MD;  Location: [facility];  Service: Urology;  Laterality: Bilateral;   PACEMAKER IMPLANT N/A 01/07/2022   Procedure: PACEMAKER IMPLANT;  Surgeon: Evans Lance, MD;  Location: [place] CV LAB;  Service: Cardiovascular;  Laterality: N/A;   ROBOT ASSISTED LAPAROSCOPIC RADICAL PROSTATECTOMY N/A 08/06/2013   Procedure: ROBOTIC ASSISTED LAPAROSCOPIC RADICAL PROSTATECTOMY/OPEN UMBILICAL HERNIA REPAIR;  Surgeon: Alexis Frock, MD;  Location: [facility];  Service: Urology;  Laterality: N/A;    Current Outpatient Medications  Medication Sig Dispense  Refill   acetaminophen (TYLENOL) 500 MG tablet Take 1,000 mg by mouth every 6 (six) hours as needed for mild pain.     amLODipine (NORVASC) 5 MG tablet Take 5 mg by mouth daily.     aspirin EC 81 MG tablet Take 81 mg by mouth 2 (two) times a week. Swallow whole.     bumetanide (BUMEX) 1 MG tablet Take 1 tablet (1 mg total) by mouth 2 (two) times daily. 60 tablet 5   carvedilol (COREG) 25 MG tablet Take 1 tablet (25 mg total) by mouth 2 (two) times daily with a meal. 60 tablet 5   glipiZIDE (GLUCOTROL XL) 5 MG 24 hr tablet Take 5 mg by mouth 2 (two) times daily.     hydrALAZINE (APRESOLINE) 50 MG tablet Take 1 tablet (50 mg total) by mouth every 8 (eight) hours. 90 tablet 5   ibuprofen (ADVIL,MOTRIN) 200 MG tablet Take 1 tablet (200 mg total) by mouth 2 (two) times daily as needed for fever. (Patient taking differently: Take 200 mg by mouth 2 (two) times daily as needed for fever or mild pain.) 30 tablet 0   metFORMIN (GLUCOPHAGE) 500 MG tablet Take 500 mg by mouth 2 (two) times daily with a meal.     pravastatin (PRAVACHOL) 20 MG tablet Take 20 mg by mouth daily.     sacubitril-valsartan (ENTRESTO) 24-26 MG Take 1 tablet by mouth 2 (two) times daily. FIRST dose 01/11/22 EVENING dose 60 tablet 5   OZEMPIC, 1 MG/DOSE, 4 MG/3ML SOPN Inject 1 mg into the skin once a week. Thursday (Patient not taking: Reported on 01/19/2022)  No current facility-administered medications for this visit.    Allergies:   Patient has no known allergies.   Social History:  The patient  reports that he has been smoking cigarettes. He has been smoking an average of .5 packs per day. He has quit using smokeless tobacco. He reports that he does not drink alcohol and does not use drugs.   Family History:  The patient's family history is not on file.  ROS:  Please see the history of present illness.    All other systems are reviewed and otherwise negative.   PHYSICAL EXAM:  VS:  BP (!) 156/74 (BP Location: Left Arm,  Patient Position: Sitting, Cuff Size: Normal)   Pulse 71   Ht '5\' 10"'$  (1.778 m)   Wt 233 lb (105.7 kg)   SpO2 98%   BMI 33.43 kg/m  BMI: Body mass index is 33.43 kg/m. Well nourished, well developed, in no acute distress HEENT: normocephalic, atraumatic Neck: no JVD, carotid bruits or masses Cardiac:  RRR; no significant murmurs, no rubs, or gallops Lungs:  CTA b/l, no wheezing, rhonchi or rales Abd: soft, nontender MS: no deformity or atrophy Ext: marked b/l LE edema with chronic skin changes (reports unchanged for years and a number of prior vascular evaluatins) Skin: warm and dry, no rash Neuro:  No gross deficits appreciated Psych: euthymic mood, full affect  PPM site: steri strips are removed without difficulty Wound edges well approximated No erythema, edema, or heat, no fluctuation, hematoma, bleeding or drainage   EKG:  not done today  Device interrogation done today and reviewed by myself:  Battery and lead measurements are good No arrhythmias   01/06/22: TTE 1. Left ventricular ejection fraction, by estimation, is 60 to 65%. The  left ventricle has normal function. The left ventricle has no regional  wall motion abnormalities. Left ventricular diastolic parameters are  indeterminate.   2. Right ventricular systolic function is normal. The right ventricular  size is normal. There is severely elevated pulmonary artery systolic  pressure.   3. Left atrial size was moderately dilated.   4. The mitral valve is abnormal. Mild mitral valve regurgitation. No  evidence of mitral stenosis.   5. Tricuspid valve regurgitation is mild to moderate.   6. The aortic valve is tricuspid. There is mild calcification of the  aortic valve. Aortic valve regurgitation is not visualized. Aortic valve  sclerosis is present, with no evidence of aortic valve stenosis.   7. The inferior vena cava is normal in size with greater than 50%  respiratory variability, suggesting right atrial  pressure of 3 mmHg.   Recent Labs: 01/06/2022: Hemoglobin 13.6; Platelets 178; TSH 0.978 01/08/2022: Magnesium 1.9 01/10/2022: BUN 15; Creatinine, Ser 1.33; Potassium 4.0; Sodium 134  No results found for requested labs within last 365 days.   Estimated Creatinine Clearance: 60.2 mL/min (A) (by C-G formula based on SCr of 1.33 mg/dL (H)).   Wt Readings from Last 3 Encounters:  01/19/22 233 lb (105.7 kg)  01/10/22 242 lb 11.6 oz (110.1 kg)  11/07/13 227 lb 15.3 oz (103.4 kg)     Other studies reviewed: Additional studies/records reviewed today include: summarized above  ASSESSMENT AND PLAN:  PPM Intact function Acute implant outputs remains No programming changes made. Site is stable, no signs of infection, well healed  HTN Not quite to goal, but reports some mebtter at home Will get a BMET today and advance his Delene Loll if we can  CHF (diastolic) No SOB, lungs are  clear He reports his LE edema as chronic and unchanged for years As above, we will advance meds as able, pending labs  Disposition: F/u with Dr. Lovena Le as scheduled  Current medicines are reviewed at length with the patient today.  The patient did not have any concerns regarding medicines.  Arthur Night, PA-C 01/19/2022 4:21 PM     [address] [place] [id number] [phone] (office)  [phone] (fax)

## 2022-01-20 LAB — BASIC METABOLIC PANEL
BUN/Creatinine Ratio: 10 (ref 10–24)
BUN: 13 mg/dL (ref 8–27)
CO2: 22 mmol/L (ref 20–29)
Calcium: 9.3 mg/dL (ref 8.6–10.2)
Chloride: 100 mmol/L (ref 96–106)
Creatinine, Ser: 1.35 mg/dL — ABNORMAL HIGH (ref 0.76–1.27)
Glucose: 110 mg/dL — ABNORMAL HIGH (ref 70–99)
Potassium: 4.1 mmol/L (ref 3.5–5.2)
Sodium: 137 mmol/L (ref 134–144)
eGFR: 55 mL/min/{1.73_m2} — ABNORMAL LOW (ref >59)

## 2022-01-23 ENCOUNTER — Other Ambulatory Visit: Payer: Self-pay

## 2022-01-23 MED ORDER — ENTRESTO 49-51 MG PO TABS: 1.0000 | ORAL_TABLET | Freq: Two times a day (BID) | ORAL | 3 refills | Status: DC

## 2022-02-06 ENCOUNTER — Other Ambulatory Visit (HOSPITAL_COMMUNITY): Payer: Self-pay

## 2022-02-06 DIAGNOSIS — I5032 Chronic diastolic (congestive) heart failure: Secondary | ICD-10-CM | POA: Diagnosis not present

## 2022-02-06 DIAGNOSIS — R600 Localized edema: Secondary | ICD-10-CM | POA: Diagnosis not present

## 2022-02-06 DIAGNOSIS — I129 Hypertensive chronic kidney disease with stage 1 through stage 4 chronic kidney disease, or unspecified chronic kidney disease: Secondary | ICD-10-CM | POA: Diagnosis not present

## 2022-02-06 DIAGNOSIS — N182 Chronic kidney disease, stage 2 (mild): Secondary | ICD-10-CM | POA: Diagnosis not present

## 2022-02-06 DIAGNOSIS — E1129 Type 2 diabetes mellitus with other diabetic kidney complication: Secondary | ICD-10-CM | POA: Diagnosis not present

## 2022-02-06 DIAGNOSIS — E669 Obesity, unspecified: Secondary | ICD-10-CM | POA: Diagnosis not present

## 2022-02-06 DIAGNOSIS — E785 Hyperlipidemia, unspecified: Secondary | ICD-10-CM | POA: Diagnosis not present

## 2022-02-06 DIAGNOSIS — E1149 Type 2 diabetes mellitus with other diabetic neurological complication: Secondary | ICD-10-CM | POA: Diagnosis not present

## 2022-02-06 DIAGNOSIS — Z950 Presence of cardiac pacemaker: Secondary | ICD-10-CM | POA: Diagnosis not present

## 2022-02-06 DIAGNOSIS — Z8546 Personal history of malignant neoplasm of prostate: Secondary | ICD-10-CM | POA: Diagnosis not present

## 2022-02-06 DIAGNOSIS — G629 Polyneuropathy, unspecified: Secondary | ICD-10-CM | POA: Diagnosis not present

## 2022-02-08 ENCOUNTER — Other Ambulatory Visit: Payer: Self-pay

## 2022-02-08 MED ORDER — CARVEDILOL 25 MG PO TABS: 25.0000 mg | ORAL_TABLET | Freq: Two times a day (BID) | ORAL | 5 refills | Status: DC

## 2022-02-08 MED ORDER — ENTRESTO 49-51 MG PO TABS: 1.0000 | ORAL_TABLET | Freq: Two times a day (BID) | ORAL | 0 refills | Status: DC

## 2022-02-08 MED ORDER — HYDRALAZINE HCL 50 MG PO TABS: 50.0000 mg | ORAL_TABLET | Freq: Three times a day (TID) | ORAL | 5 refills | Status: DC

## 2022-02-08 MED ORDER — BUMETANIDE 1 MG PO TABS: 1.0000 mg | ORAL_TABLET | Freq: Two times a day (BID) | ORAL | 5 refills | Status: DC

## 2022-02-13 ENCOUNTER — Ambulatory Visit: Payer: Medicare HMO | Admitting: Pharmacist

## 2022-02-13 VITALS — BP 144/75 | HR 61 | Wt 234.0 lb

## 2022-02-13 DIAGNOSIS — I5032 Chronic diastolic (congestive) heart failure: Secondary | ICD-10-CM | POA: Diagnosis not present

## 2022-02-13 DIAGNOSIS — I10 Essential (primary) hypertension: Secondary | ICD-10-CM | POA: Diagnosis not present

## 2022-02-13 DIAGNOSIS — I441 Atrioventricular block, second degree: Secondary | ICD-10-CM

## 2022-02-13 MED ORDER — ENTRESTO 97-103 MG PO TABS: 1.0000 | ORAL_TABLET | Freq: Two times a day (BID) | ORAL | 12 refills | Status: DC

## 2022-02-13 NOTE — Progress Notes (Signed)
Patient ID: Arthur Carr                 DOB: [birth year]                      MRN: [id number]     HPI: Arthur Carr is a 71 y.o. male referred by Dr. Lovena Le to pharmacy clinic for HF medication management. PMH is significant for HTN, DM, CKD (II), HLD. Most recent LVEF 60-65% on 01/05/22. Admitted 01/06/22 with progressive fatigue found with CHB by his PMD and sent to the hospital, initially stable developed progressive SOB, flash edema and HTN emergency requiring NTG, aggressive diuresis and ICU management. Pt underwent PPM implant the following day, BP meds titrated, diuresed well, and discharged 01/10/22 with SBPs 140's-150's. His ACEi was transitioned to [facility], Lasix to Bumex and other meds titrated. Pt last saw Tommye Standard on 01/19/22 where pt reported feeling well, however BP was elevated at 156/74 and he was referred to PharmD for medication titration. Labs at that time showed creatinine of 1.35 (at baseline) and potassium of 4.1.  Today he presents to pharmacy clinic for further medication titration. Symptomatically, he is feeling well, denies dizziness, lightheadedness, and fatigue. Denies chest pain or palpitations. Feels SOB when exercising. Able to complete all ADLs. Activity level moderate. He checks his weight daily at home (normal range 230 - 234 lbs). Reports mild LEE that has persisted for years, no PND, or orthopnea. Appetite has been good. He does adhere to a low-salt diet. Pt reports that his pharmacist at [facility] told him that he should not be taking amlodipine with his Delene Loll, so he stopped taking amlodipine. He did notice mild improvement to his persistent lower extremity edema after discontinuation. Pt reported attempting Jardiance in the past, but it was discontinued due to multiple UTIs. Pt reported no longer taking Ozempic given his sugars are well controlled on metformin 500 mg BID and glipizide XL 5 mg BID. His PCP stopped Ozempic given glucoses were below goal. Pt reports  diarrhea when increasing metformin past 500 mg per dose. Pt's weight today was at baseline. Pt reports no issues with current medications.  Current CHF meds: Entresto 49/51 mg BID, carvedilol 25 mg BID, hydralazine 50 mg TID (no nitrate), bumetanide 1 mg BID Previously tried: benazepril BP goal: <130/80  Social History: 0.5 PPD smoker. Did not have time to discuss smoking cessation at this visit.   Diet: Eggs or oatmeal with fruit for breakfast. Does not always eat lunch, but eats things like tomato sandwich for lunch. Dinner is generally baked chicken, vegetables, and beans.  Exercise: none, walks at grocery store, performing errands, etc   Home BP readings:  02/13/22: 133/70 Reports BP around 130/80 on amlodipine  Wt Readings from Last 3 Encounters:  01/19/22 233 lb (105.7 kg)  01/10/22 242 lb 11.6 oz (110.1 kg)  11/07/13 227 lb 15.3 oz (103.4 kg)   BP Readings from Last 3 Encounters:  01/19/22 (!) 156/74  01/10/22 (!) 157/77  11/09/13 (!) 167/85   Pulse Readings from Last 3 Encounters:  01/19/22 71  01/10/22 77  11/09/13 84    Renal function: CrCl cannot be calculated (Patient's most recent lab result is older than the maximum 21 days allowed.).  Past Medical History:  Diagnosis Date   Arthritis    Diabetes mellitus without complication ([place])    Hypertension    Prostate cancer [facility])     Current Outpatient Medications on File Prior to Visit  Medication Sig Dispense Refill   acetaminophen (TYLENOL) 500 MG tablet Take 1,000 mg by mouth every 6 (six) hours as needed for mild pain.     amLODipine (NORVASC) 5 MG tablet Take 5 mg by mouth daily.     aspirin EC 81 MG tablet Take 81 mg by mouth 2 (two) times a week. Swallow whole.     bumetanide (BUMEX) 1 MG tablet Take 1 tablet (1 mg total) by mouth 2 (two) times daily. 60 tablet 5   carvedilol (COREG) 25 MG tablet Take 1 tablet (25 mg total) by mouth 2 (two) times daily with a meal. 60 tablet 5   glipiZIDE (GLUCOTROL XL)  5 MG 24 hr tablet Take 5 mg by mouth 2 (two) times daily.     hydrALAZINE (APRESOLINE) 50 MG tablet Take 1 tablet (50 mg total) by mouth every 8 (eight) hours. 90 tablet 5   ibuprofen (ADVIL,MOTRIN) 200 MG tablet Take 1 tablet (200 mg total) by mouth 2 (two) times daily as needed for fever. (Patient taking differently: Take 200 mg by mouth 2 (two) times daily as needed for fever or mild pain.) 30 tablet 0   metFORMIN (GLUCOPHAGE) 500 MG tablet Take 500 mg by mouth 2 (two) times daily with a meal.     OZEMPIC, 1 MG/DOSE, 4 MG/3ML SOPN Inject 1 mg into the skin once a week. Thursday (Patient not taking: Reported on 01/19/2022)     pravastatin (PRAVACHOL) 20 MG tablet Take 20 mg by mouth daily.     sacubitril-valsartan (ENTRESTO) 49-51 MG Take 1 tablet by mouth 2 (two) times daily. 60 tablet 0   No current facility-administered medications on file prior to visit.    No Known Allergies   Assessment/Plan:  1. CHF -  BP today of 144/75 mmHg is above goal of <130/80. Pt reports BP of 130s/70s, even after self-discontinuation of amlodipine. Given slight improvement of lower extremity edema, will hold off on restarting amlodipine. Will increase Entresto to 97/103 mg twice daily for better BP control. Continue carvedilol 25 mg BID, hydralazine  50 mg TID, and bumetanide 1 mg BID. Pt instructed to restart amlodipine if unable to tolerate increased Entresto dose. Pt encouraged to increase activity with an end goal of 150 minutes of moderate intensity exercise per week. Counseled patient on healthy diet and exercise, including limiting salt. BMP ordered for 02/23/22 and office visit on 03/06/22.   Reatha Harps, PharmD, PGY2 Cardiology resident

## 2022-02-13 NOTE — Patient Instructions (Addendum)
Increase dose of Entresto to 97/103 mg tablet. You can take 2 tablets of the 49/51 mg twice daily until gone.   If unable to tolerate increased dose of Entresto, resume amlodipine 5 mg daily  Continue carvedilol 25 mg twice daily, hydralazine 50 mg three times daily, and bumetanide 1 mg twice daily

## 2022-02-23 ENCOUNTER — Other Ambulatory Visit: Payer: Medicare HMO

## 2022-02-23 DIAGNOSIS — I5032 Chronic diastolic (congestive) heart failure: Secondary | ICD-10-CM

## 2022-02-23 LAB — BASIC METABOLIC PANEL
BUN/Creatinine Ratio: 7 — ABNORMAL LOW (ref 10–24)
BUN: 10 mg/dL (ref 8–27)
CO2: 24 mmol/L (ref 20–29)
Calcium: 8.9 mg/dL (ref 8.6–10.2)
Chloride: 98 mmol/L (ref 96–106)
Creatinine, Ser: 1.44 mg/dL — ABNORMAL HIGH (ref 0.76–1.27)
Glucose: 198 mg/dL — ABNORMAL HIGH (ref 70–99)
Potassium: 3.9 mmol/L (ref 3.5–5.2)
Sodium: 135 mmol/L (ref 134–144)
eGFR: 51 mL/min/{1.73_m2} — ABNORMAL LOW (ref >59)

## 2022-02-24 ENCOUNTER — Telehealth: Payer: Self-pay | Admitting: Pharmacist

## 2022-02-24 NOTE — Telephone Encounter (Signed)
Labs stable after Entresto increase. Continue Entresto 97/'103mg'$  BID  LVM for pt to call back

## 2022-03-01 NOTE — Telephone Encounter (Signed)
Patient called back. Informed of lab results. Will keep follow up 8/14.

## 2022-03-05 NOTE — Progress Notes (Unsigned)
Patient ID: Raynaldo Falco                 DOB: 1951-03-22                      MRN: [id number]     HPI: Lealand Elting is a 71 y.o. male referred by Dr. Lovena Le to pharmacy clinic for HF medication management. PMH is significant for HTN, DM, CKD (II), HLD. Most recent LVEF 60-65% on 01/05/22. Admitted 01/06/22 with progressive fatigue found with CHB by his PMD and sent to the hospital. There he developed progressive ShOB, flash pulmonary edema and HTN emergency requiring NTG, aggressive diuresis and ICU management. Pt underwent PPM implant the following day, BP meds were titrated, he was diuresed well, and discharged 01/10/22 with SBPs 140's-150's. His ACEi was transitioned to [facility], Lasix to Bumex and other meds titrated. Pt last saw Tommye Standard on 01/19/22 where pt reported feeling well, however BP was elevated at 156/74 and he was referred to PharmD for medication titration. At last visit with PharmD on 02/13/22, pt was asymptomatic, but hypertensive and Entresto was increased. Labs afterwards 02/23/22 were stable.    Today he presents to pharmacy clinic for further medication titration.   Options: change to bidil vs spiro (K 3.9/Cr 1.44) Ask ibuprofen and smoking Clean up med list  Current CHF meds: Entresto 97/103 mg BID, carvedilol 25 mg BID, hydralazine 50 mg TID (no nitrate), bumetanide 1 mg BID Previously tried: benazepril, Jardiance (UTIs) BP goal: <130/80  Social History: 0.5 PPD smoker. Did not have time to discuss smoking cessation at this visit.   Diet: Eggs or oatmeal with fruit for breakfast. Does not always eat lunch, but eats things like tomato sandwich for lunch. Dinner is generally baked chicken, vegetables, and beans.  Exercise: none, walks at grocery store, performing errands, etc   Home BP readings:  02/13/22: 133/70 Reports BP around 130/80 on amlodipine  Wt Readings from Last 3 Encounters:  02/13/22 234 lb (106.1 kg)  01/19/22 233 lb (105.7 kg)  01/10/22 242 lb  11.6 oz (110.1 kg)   BP Readings from Last 3 Encounters:  02/13/22 (!) 144/75  01/19/22 (!) 156/74  01/10/22 (!) 157/77   Pulse Readings from Last 3 Encounters:  02/13/22 61  01/19/22 71  01/10/22 77    Renal function: CrCl cannot be calculated (Unknown ideal weight.).  Past Medical History:  Diagnosis Date   Arthritis    Diabetes mellitus without complication ([place])    Hypertension    Prostate cancer ([place])     Current Outpatient Medications on File Prior to Visit  Medication Sig Dispense Refill   acetaminophen (TYLENOL) 500 MG tablet Take 1,000 mg by mouth every 6 (six) hours as needed for mild pain.     amLODipine (NORVASC) 5 MG tablet Take 5 mg by mouth daily.     aspirin EC 81 MG tablet Take 81 mg by mouth 2 (two) times a week. Swallow whole.     bumetanide (BUMEX) 1 MG tablet Take 1 tablet (1 mg total) by mouth 2 (two) times daily. 60 tablet 5   carvedilol (COREG) 25 MG tablet Take 1 tablet (25 mg total) by mouth 2 (two) times daily with a meal. 60 tablet 5   glipiZIDE (GLUCOTROL XL) 5 MG 24 hr tablet Take 5 mg by mouth 2 (two) times daily.     hydrALAZINE (APRESOLINE) 50 MG tablet Take 1 tablet (50 mg total) by mouth every 8 (eight)  hours. 90 tablet 5   ibuprofen (ADVIL,MOTRIN) 200 MG tablet Take 1 tablet (200 mg total) by mouth 2 (two) times daily as needed for fever. (Patient taking differently: Take 200 mg by mouth 2 (two) times daily as needed for fever or mild pain.) 30 tablet 0   metFORMIN (GLUCOPHAGE) 500 MG tablet Take 500 mg by mouth 2 (two) times daily with a meal.     OZEMPIC, 1 MG/DOSE, 4 MG/3ML SOPN Inject 1 mg into the skin once a week. Thursday (Patient not taking: Reported on 01/19/2022)     pravastatin (PRAVACHOL) 20 MG tablet Take 20 mg by mouth daily.     sacubitril-valsartan (ENTRESTO) 97-103 MG Take 1 tablet by mouth 2 (two) times daily. 60 tablet 12   No current facility-administered medications on file prior to visit.    No Known  Allergies   Assessment/Plan:  1. CHF -  BP today of XXX mmHg is XXX goal of <130/80.   Reatha Harps, PharmD, PGY2 Cardiology resident

## 2022-03-06 ENCOUNTER — Ambulatory Visit: Payer: Medicare HMO

## 2022-04-10 ENCOUNTER — Ambulatory Visit (INDEPENDENT_AMBULATORY_CARE_PROVIDER_SITE_OTHER): Payer: Medicare HMO

## 2022-04-10 DIAGNOSIS — I5032 Chronic diastolic (congestive) heart failure: Secondary | ICD-10-CM | POA: Diagnosis not present

## 2022-04-11 LAB — CUP PACEART REMOTE DEVICE CHECK
Date Time Interrogation Session: 20230918134921
Implantable Lead Implant Date: 20230617
Implantable Lead Implant Date: 20230617
Implantable Lead Location: 753859
Implantable Lead Location: 753860
Implantable Lead Model: 377
Implantable Lead Model: 377
Implantable Lead Serial Number: 8000835361
Implantable Lead Serial Number: 8000925809
Implantable Pulse Generator Implant Date: 20230617
Pulse Gen Model: 407145
Pulse Gen Serial Number: 70432387

## 2022-04-13 ENCOUNTER — Encounter: Payer: Self-pay | Admitting: Internal Medicine

## 2022-04-13 ENCOUNTER — Ambulatory Visit: Payer: Medicare HMO | Attending: Internal Medicine | Admitting: Internal Medicine

## 2022-04-13 VITALS — BP 138/72 | HR 70 | Ht 70.0 in | Wt 240.0 lb

## 2022-04-13 DIAGNOSIS — I10 Essential (primary) hypertension: Secondary | ICD-10-CM

## 2022-04-13 DIAGNOSIS — I442 Atrioventricular block, complete: Secondary | ICD-10-CM | POA: Diagnosis not present

## 2022-04-13 DIAGNOSIS — Z950 Presence of cardiac pacemaker: Secondary | ICD-10-CM

## 2022-04-13 MED ORDER — CARVEDILOL 25 MG PO TABS: ORAL_TABLET | ORAL | 5 refills | Status: DC

## 2022-04-13 NOTE — Patient Instructions (Addendum)
Medication Instructions:  Your physician has recommended you make the following change in your medication:  Stop taking Amlodipine TODAY, 04/13/2022.   2.  Increase COREG ( Carvedilol )    Take one and a half tablets ( 37.5 Mg ) by mouth twice daily    Lab Work: None ordered.  If you have labs (blood work) drawn today and your tests are completely normal, you will receive your results only by: [place] (if you have MyChart) OR A paper copy in the mail If you have any lab test that is abnormal or we need to change your treatment, we will call you to review the results.  Testing/Procedures: None ordered.  Follow-Up: At [facility], you and your health needs are our priority.  As part of our continuing mission to provide you with exceptional heart care, we have created designated Provider Care Teams.  These Care Teams include your primary Cardiologist (physician) and Advanced Practice Providers (APPs -  Physician Assistants and Nurse Practitioners) who all work together to provide you with the care you need, when you need it.  We recommend signing up for the patient portal called "MyChart".  Sign up information is provided on this After Visit Summary.  MyChart is used to connect with patients for Virtual Visits (Telemedicine).  Patients are able to view lab/test results, encounter notes, upcoming appointments, etc.  Non-urgent messages can be sent to your provider as well.   To learn more about what you can do with MyChart, go to [email or web address].    Your next appointment:   1 year(s)  The format for your next appointment:   In Person  Provider:   Cristopher Peru, MD{or one of the following Advanced Practice Providers on your designated Care Team:   Tommye Standard, [place] Legrand Como "Jonni Sanger" Chalmers Cater, [place]  Remote monitoring is used to monitor your Pacemaker from home. This monitoring reduces the number of office visits required to check your device to one time per year. It allows  [place] to keep an eye on the functioning of your device to ensure it is working properly. You are scheduled for a device check from home on 07/10/2022. You may send your transmission at any time that day. If you have a wireless device, the transmission will be sent automatically. After your physician reviews your transmission, you will receive a postcard with your next transmission date.  Important Information About Sugar

## 2022-04-13 NOTE — Progress Notes (Signed)
HPI Arthur Carr returns today for followup. He is a pleasant 71 yo man with a h/o CHB, s/p PPM insertion along with DM, dyslipidemia and HTN. He has preserved LV function and denies chest pain or sob. No syncope. He has been bothered by progressive peripheral edema, left leg worse than the right.  No Known Allergies   Current Outpatient Medications  Medication Sig Dispense Refill   acetaminophen (TYLENOL) 500 MG tablet Take 1,000 mg by mouth every 6 (six) hours as needed for mild pain.     aspirin EC 81 MG tablet Take 81 mg by mouth 2 (two) times a week. Swallow whole.     bumetanide (BUMEX) 1 MG tablet Take 1 tablet (1 mg total) by mouth 2 (two) times daily. 60 tablet 5   glipiZIDE (GLUCOTROL XL) 5 MG 24 hr tablet Take 5 mg by mouth 2 (two) times daily.     hydrALAZINE (APRESOLINE) 50 MG tablet Take 1 tablet (50 mg total) by mouth every 8 (eight) hours. 90 tablet 5   ibuprofen (ADVIL,MOTRIN) 200 MG tablet Take 1 tablet (200 mg total) by mouth 2 (two) times daily as needed for fever. (Patient taking differently: Take 200 mg by mouth 2 (two) times daily as needed for fever or mild pain.) 30 tablet 0   metFORMIN (GLUCOPHAGE) 500 MG tablet Take 500 mg by mouth 2 (two) times daily with a meal.     pravastatin (PRAVACHOL) 20 MG tablet Take 20 mg by mouth daily.     sacubitril-valsartan (ENTRESTO) 97-103 MG Take 1 tablet by mouth 2 (two) times daily. 60 tablet 12   carvedilol (COREG) 25 MG tablet Take one and a half tablets ( 37.5 Mg ) by mouth twice daily 60 tablet 5   OZEMPIC, 1 MG/DOSE, 4 MG/3ML SOPN Inject 1 mg into the skin once a week. Thursday (Patient not taking: Reported on 01/19/2022)     No current facility-administered medications for this visit.     Past Medical History:  Diagnosis Date   Arthritis    Diabetes mellitus without complication ([place])    Hypertension    Prostate cancer ([place])     ROS:   All systems reviewed and negative except as noted in the HPI.   Past  Surgical History:  Procedure Laterality Date   JOINT REPLACEMENT  2007   RT TOTAL HIP   LYMPHADENECTOMY Bilateral 08/06/2013   Procedure: LYMPHADENECTOMY AND INDOCYANINE GREEN DYE  INJECTION;  Surgeon: Alexis Frock, MD;  Location: [facility];  Service: Urology;  Laterality: Bilateral;   PACEMAKER IMPLANT N/A 01/07/2022   Procedure: PACEMAKER IMPLANT;  Surgeon: Evans Lance, MD;  Location: [place] [facility];  Service: Cardiovascular;  Laterality: N/A;   ROBOT ASSISTED LAPAROSCOPIC RADICAL PROSTATECTOMY N/A 08/06/2013   Procedure: ROBOTIC ASSISTED LAPAROSCOPIC RADICAL PROSTATECTOMY/OPEN UMBILICAL HERNIA REPAIR;  Surgeon: Alexis Frock, MD;  Location: [facility];  Service: Urology;  Laterality: N/A;     History reviewed. No pertinent family history.   Social History   Socioeconomic History   Marital status: Married    Spouse name: Not on file   Number of children: Not on file   Years of education: Not on file   Highest education level: Not on file  Occupational History   Not on file  Tobacco Use   Smoking status: Former    Packs/day: 0.50    Types: Cigarettes    Quit date: 12/2021    Years since quitting: 0.3   Smokeless  tobacco: Former  Substance and Sexual Activity   Alcohol use: No   Drug use: No   Sexual activity: Not Currently  Other Topics Concern   Not on file  Social History Narrative   Not on file   Social Determinants of Health   Financial Resource Strain: Not on file  Food Insecurity: Not on file  Transportation Needs: Not on file  Physical Activity: Not on file  Stress: Not on file  Social Connections: Not on file  Intimate Partner Violence: Not on file     BP 138/72   Pulse 70   Ht '5\' 10"'$  (1.778 m)   Wt 240 lb (108.9 kg)   SpO2 96%   BMI 34.44 kg/m   Physical Exam:  Well appearing NAD HEENT: Unremarkable Neck:  No JVD, no thyromegally Lymphatics:  No adenopathy Back:  No CVA tenderness Lungs:  Clear HEART:  Regular rate rhythm, no murmurs,  no rubs, no clicks Abd:  soft, positive bowel sounds, no organomegally, no rebound, no guarding Ext:  2 plus pulses, 3+ left and 2+ right leg edema, no cyanosis, no clubbing Skin:  No rashes no nodules Neuro:  CN II through XII intact, motor grossly intact  EKG - NSR with ventriucular pacing.  DEVICE  Normal device function.  See PaceArt for details.   Assess/Plan:  CHB - he is asymptomatic s/p PPM insertion. His escape is slow. PPM - his Biotronik DDD PM is working normally. We will recheck in several months. Peripheral edema - His legs are swollen. I asked him to stop the amlodipine and reduce his salt intake. HTN - I asked him to increase the dose of his coreg and stop the amlodipine.  Arthur Overlie Kaylena Pacifico,MD

## 2022-04-25 NOTE — Progress Notes (Signed)
Remote pacemaker transmission.   

## 2022-06-13 DIAGNOSIS — Z8546 Personal history of malignant neoplasm of prostate: Secondary | ICD-10-CM | POA: Diagnosis not present

## 2022-06-13 DIAGNOSIS — N182 Chronic kidney disease, stage 2 (mild): Secondary | ICD-10-CM | POA: Diagnosis not present

## 2022-06-13 DIAGNOSIS — E1129 Type 2 diabetes mellitus with other diabetic kidney complication: Secondary | ICD-10-CM | POA: Diagnosis not present

## 2022-06-13 DIAGNOSIS — E669 Obesity, unspecified: Secondary | ICD-10-CM | POA: Diagnosis not present

## 2022-06-13 DIAGNOSIS — I5032 Chronic diastolic (congestive) heart failure: Secondary | ICD-10-CM | POA: Diagnosis not present

## 2022-06-13 DIAGNOSIS — I130 Hypertensive heart and chronic kidney disease with heart failure and stage 1 through stage 4 chronic kidney disease, or unspecified chronic kidney disease: Secondary | ICD-10-CM | POA: Diagnosis not present

## 2022-06-13 DIAGNOSIS — E785 Hyperlipidemia, unspecified: Secondary | ICD-10-CM | POA: Diagnosis not present

## 2022-06-13 DIAGNOSIS — R69 Illness, unspecified: Secondary | ICD-10-CM | POA: Diagnosis not present

## 2022-06-13 DIAGNOSIS — Z950 Presence of cardiac pacemaker: Secondary | ICD-10-CM | POA: Diagnosis not present

## 2022-06-13 DIAGNOSIS — Z23 Encounter for immunization: Secondary | ICD-10-CM | POA: Diagnosis not present

## 2022-06-23 DIAGNOSIS — R32 Unspecified urinary incontinence: Secondary | ICD-10-CM | POA: Diagnosis not present

## 2022-06-23 DIAGNOSIS — Z008 Encounter for other general examination: Secondary | ICD-10-CM | POA: Diagnosis not present

## 2022-06-23 DIAGNOSIS — N1831 Chronic kidney disease, stage 3a: Secondary | ICD-10-CM | POA: Diagnosis not present

## 2022-06-23 DIAGNOSIS — Z87891 Personal history of nicotine dependence: Secondary | ICD-10-CM | POA: Diagnosis not present

## 2022-06-23 DIAGNOSIS — Z7984 Long term (current) use of oral hypoglycemic drugs: Secondary | ICD-10-CM | POA: Diagnosis not present

## 2022-06-23 DIAGNOSIS — Z8546 Personal history of malignant neoplasm of prostate: Secondary | ICD-10-CM | POA: Diagnosis not present

## 2022-06-23 DIAGNOSIS — Z96649 Presence of unspecified artificial hip joint: Secondary | ICD-10-CM | POA: Diagnosis not present

## 2022-06-23 DIAGNOSIS — I130 Hypertensive heart and chronic kidney disease with heart failure and stage 1 through stage 4 chronic kidney disease, or unspecified chronic kidney disease: Secondary | ICD-10-CM | POA: Diagnosis not present

## 2022-06-23 DIAGNOSIS — Z8249 Family history of ischemic heart disease and other diseases of the circulatory system: Secondary | ICD-10-CM | POA: Diagnosis not present

## 2022-06-23 DIAGNOSIS — E785 Hyperlipidemia, unspecified: Secondary | ICD-10-CM | POA: Diagnosis not present

## 2022-06-23 DIAGNOSIS — I509 Heart failure, unspecified: Secondary | ICD-10-CM | POA: Diagnosis not present

## 2022-06-23 DIAGNOSIS — N529 Male erectile dysfunction, unspecified: Secondary | ICD-10-CM | POA: Diagnosis not present

## 2022-07-10 ENCOUNTER — Ambulatory Visit (INDEPENDENT_AMBULATORY_CARE_PROVIDER_SITE_OTHER): Payer: Medicare HMO

## 2022-07-10 DIAGNOSIS — I442 Atrioventricular block, complete: Secondary | ICD-10-CM | POA: Diagnosis not present

## 2022-07-11 LAB — CUP PACEART REMOTE DEVICE CHECK
Date Time Interrogation Session: 20231219071654
Implantable Lead Connection Status: 753985
Implantable Lead Connection Status: 753985
Implantable Lead Implant Date: 20230617
Implantable Lead Implant Date: 20230617
Implantable Lead Location: 753859
Implantable Lead Location: 753860
Implantable Lead Model: 377
Implantable Lead Model: 377
Implantable Lead Serial Number: 8000835361
Implantable Lead Serial Number: 8000925809
Implantable Pulse Generator Implant Date: 20230617
Pulse Gen Model: 407145
Pulse Gen Serial Number: 70432387

## 2022-08-16 NOTE — Progress Notes (Signed)
Remote pacemaker transmission.   

## 2022-08-22 ENCOUNTER — Other Ambulatory Visit: Payer: Self-pay | Admitting: Physician Assistant

## 2022-08-22 MED ORDER — CARVEDILOL 25 MG PO TABS: ORAL_TABLET | ORAL | 2 refills | Status: DC

## 2022-09-14 ENCOUNTER — Other Ambulatory Visit: Payer: Self-pay | Admitting: Physician Assistant

## 2022-10-09 ENCOUNTER — Ambulatory Visit: Payer: Medicare HMO | Attending: Internal Medicine

## 2022-10-09 DIAGNOSIS — I442 Atrioventricular block, complete: Secondary | ICD-10-CM | POA: Diagnosis not present

## 2022-10-09 DIAGNOSIS — I10 Essential (primary) hypertension: Secondary | ICD-10-CM | POA: Diagnosis not present

## 2022-10-09 DIAGNOSIS — E291 Testicular hypofunction: Secondary | ICD-10-CM | POA: Diagnosis not present

## 2022-10-09 DIAGNOSIS — Z125 Encounter for screening for malignant neoplasm of prostate: Secondary | ICD-10-CM | POA: Diagnosis not present

## 2022-10-09 DIAGNOSIS — N529 Male erectile dysfunction, unspecified: Secondary | ICD-10-CM | POA: Diagnosis not present

## 2022-10-09 DIAGNOSIS — E1129 Type 2 diabetes mellitus with other diabetic kidney complication: Secondary | ICD-10-CM | POA: Diagnosis not present

## 2022-10-09 DIAGNOSIS — R7989 Other specified abnormal findings of blood chemistry: Secondary | ICD-10-CM | POA: Diagnosis not present

## 2022-10-09 DIAGNOSIS — E785 Hyperlipidemia, unspecified: Secondary | ICD-10-CM | POA: Diagnosis not present

## 2022-10-11 LAB — CUP PACEART REMOTE DEVICE CHECK
Battery Voltage: 95
Date Time Interrogation Session: 20240318081352
Implantable Lead Connection Status: 753985
Implantable Lead Connection Status: 753985
Implantable Lead Implant Date: 20230617
Implantable Lead Implant Date: 20230617
Implantable Lead Location: 753859
Implantable Lead Location: 753860
Implantable Lead Model: 377
Implantable Lead Model: 377
Implantable Lead Serial Number: 8000835361
Implantable Lead Serial Number: 8000925809
Implantable Pulse Generator Implant Date: 20230617
Pulse Gen Model: 407145
Pulse Gen Serial Number: 70432387

## 2022-10-16 DIAGNOSIS — Z23 Encounter for immunization: Secondary | ICD-10-CM | POA: Diagnosis not present

## 2022-10-16 DIAGNOSIS — E785 Hyperlipidemia, unspecified: Secondary | ICD-10-CM | POA: Diagnosis not present

## 2022-10-16 DIAGNOSIS — E1149 Type 2 diabetes mellitus with other diabetic neurological complication: Secondary | ICD-10-CM | POA: Diagnosis not present

## 2022-10-16 DIAGNOSIS — Z950 Presence of cardiac pacemaker: Secondary | ICD-10-CM | POA: Diagnosis not present

## 2022-10-16 DIAGNOSIS — Z0000 Encounter for general adult medical examination without abnormal findings: Secondary | ICD-10-CM | POA: Diagnosis not present

## 2022-10-16 DIAGNOSIS — I5032 Chronic diastolic (congestive) heart failure: Secondary | ICD-10-CM | POA: Diagnosis not present

## 2022-10-16 DIAGNOSIS — R600 Localized edema: Secondary | ICD-10-CM | POA: Diagnosis not present

## 2022-10-16 DIAGNOSIS — Z1331 Encounter for screening for depression: Secondary | ICD-10-CM | POA: Diagnosis not present

## 2022-10-16 DIAGNOSIS — E1129 Type 2 diabetes mellitus with other diabetic kidney complication: Secondary | ICD-10-CM | POA: Diagnosis not present

## 2022-10-16 DIAGNOSIS — Z1339 Encounter for screening examination for other mental health and behavioral disorders: Secondary | ICD-10-CM | POA: Diagnosis not present

## 2022-10-16 DIAGNOSIS — R82998 Other abnormal findings in urine: Secondary | ICD-10-CM | POA: Diagnosis not present

## 2022-10-16 DIAGNOSIS — I130 Hypertensive heart and chronic kidney disease with heart failure and stage 1 through stage 4 chronic kidney disease, or unspecified chronic kidney disease: Secondary | ICD-10-CM | POA: Diagnosis not present

## 2022-10-16 DIAGNOSIS — F17211 Nicotine dependence, cigarettes, in remission: Secondary | ICD-10-CM | POA: Diagnosis not present

## 2022-10-16 DIAGNOSIS — Z8546 Personal history of malignant neoplasm of prostate: Secondary | ICD-10-CM | POA: Diagnosis not present

## 2022-10-16 DIAGNOSIS — N1832 Chronic kidney disease, stage 3b: Secondary | ICD-10-CM | POA: Diagnosis not present

## 2022-11-21 DIAGNOSIS — E785 Hyperlipidemia, unspecified: Secondary | ICD-10-CM | POA: Diagnosis not present

## 2022-11-21 NOTE — Progress Notes (Signed)
Remote pacemaker transmission.   

## 2023-01-08 ENCOUNTER — Ambulatory Visit (INDEPENDENT_AMBULATORY_CARE_PROVIDER_SITE_OTHER): Payer: Medicare HMO

## 2023-01-08 DIAGNOSIS — I442 Atrioventricular block, complete: Secondary | ICD-10-CM

## 2023-01-19 LAB — CUP PACEART REMOTE DEVICE CHECK
Battery Voltage: 90
Date Time Interrogation Session: 20240628115019
Implantable Lead Connection Status: 753985
Implantable Lead Connection Status: 753985
Implantable Lead Implant Date: 20230617
Implantable Lead Implant Date: 20230617
Implantable Lead Location: 753859
Implantable Lead Location: 753860
Implantable Lead Model: 377
Implantable Lead Model: 377
Implantable Lead Serial Number: 8000835361
Implantable Lead Serial Number: 8000925809
Implantable Pulse Generator Implant Date: 20230617
Pulse Gen Model: 407145
Pulse Gen Serial Number: 70432387

## 2023-01-29 NOTE — Progress Notes (Signed)
Remote pacemaker transmission.   

## 2023-02-19 ENCOUNTER — Other Ambulatory Visit: Payer: Self-pay | Admitting: Internal Medicine

## 2023-03-05 IMAGING — DX DG CHEST 1V PORT
1 series · 1 of 1 positions shown · non-contrast
Comparison: Two-view chest x-ray 11/07/2013

CLINICAL DATA: Shortness of breath.  Chest pain.

EXAM:
PORTABLE CHEST 1 VIEW

[chest ap]
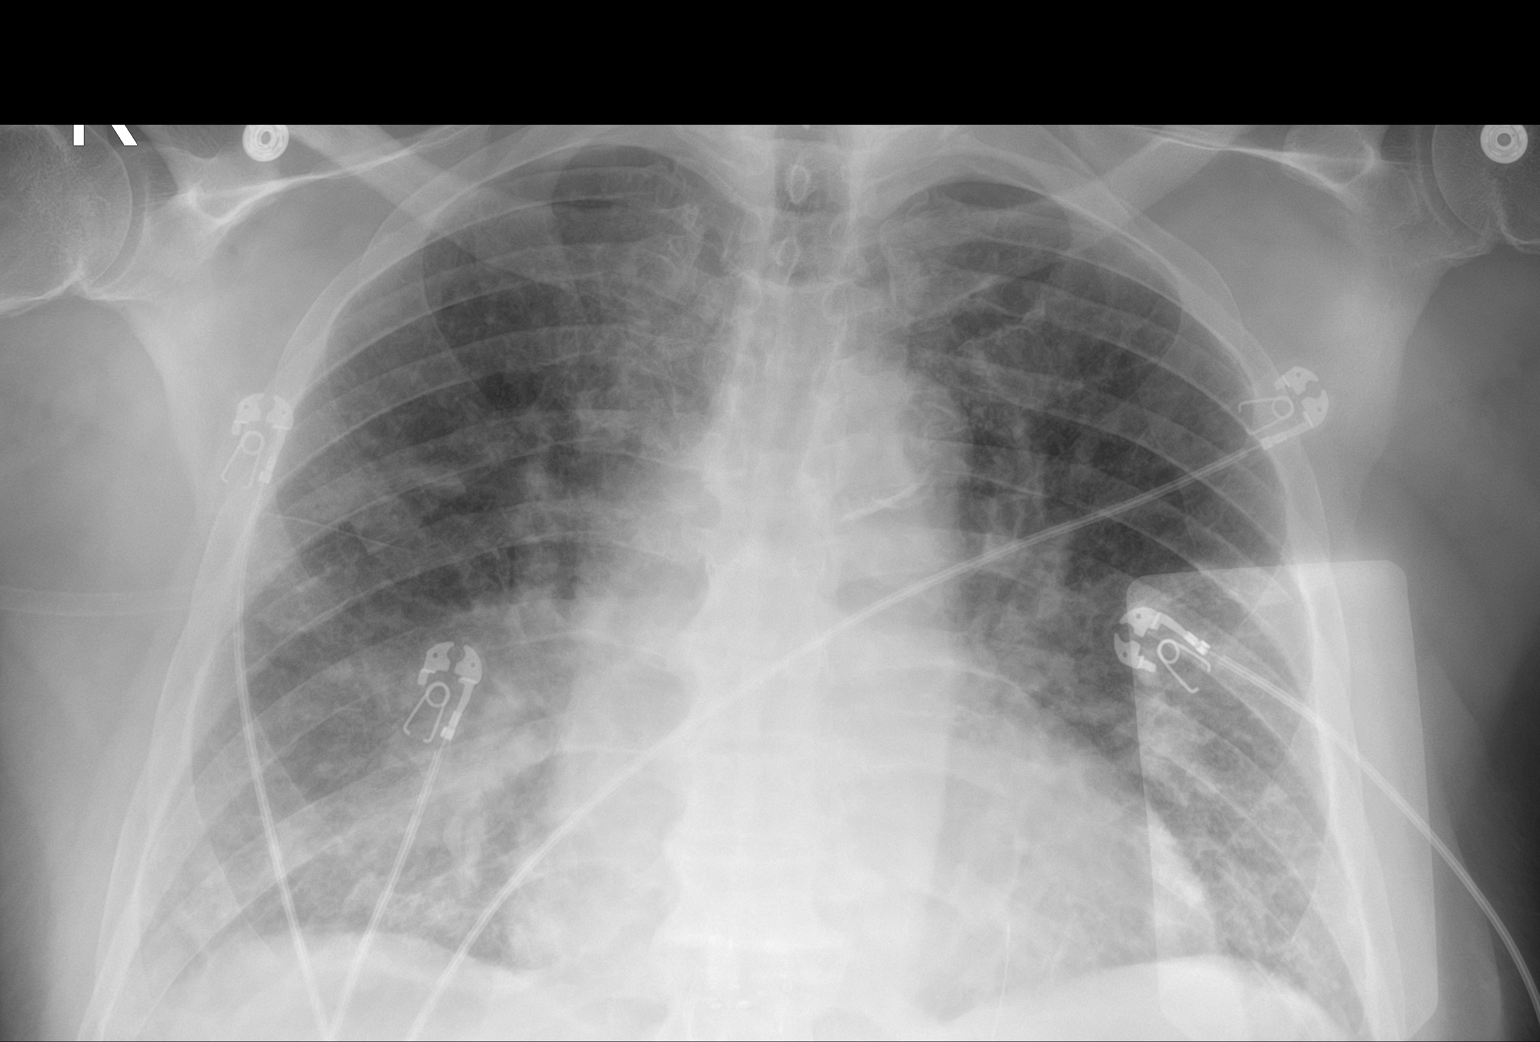

[1 of 1 positions shown; findings below may reference images not displayed]

FINDINGS: The heart size exaggerated by low lung volumes. Atherosclerotic
changes are present in the aortic arch.

Asymmetric airspace disease is present in the right lower lobe. Mild
pulmonary vascular congestion is present. No other significant
airspace consolidation is present.
IMPRESSION: 1. Asymmetric airspace disease in the right lower lobe concerning
for pneumonia.
2. Mild pulmonary vascular congestion.
3. Atherosclerosis.

## 2023-03-07 IMAGING — DX DG CHEST 2V
2 series · 2 of 2 positions shown · non-contrast
Comparison: 01/06/2022

CLINICAL DATA: Status post pacemaker placement.

EXAM:
CHEST - 2 VIEW

[chest lat]
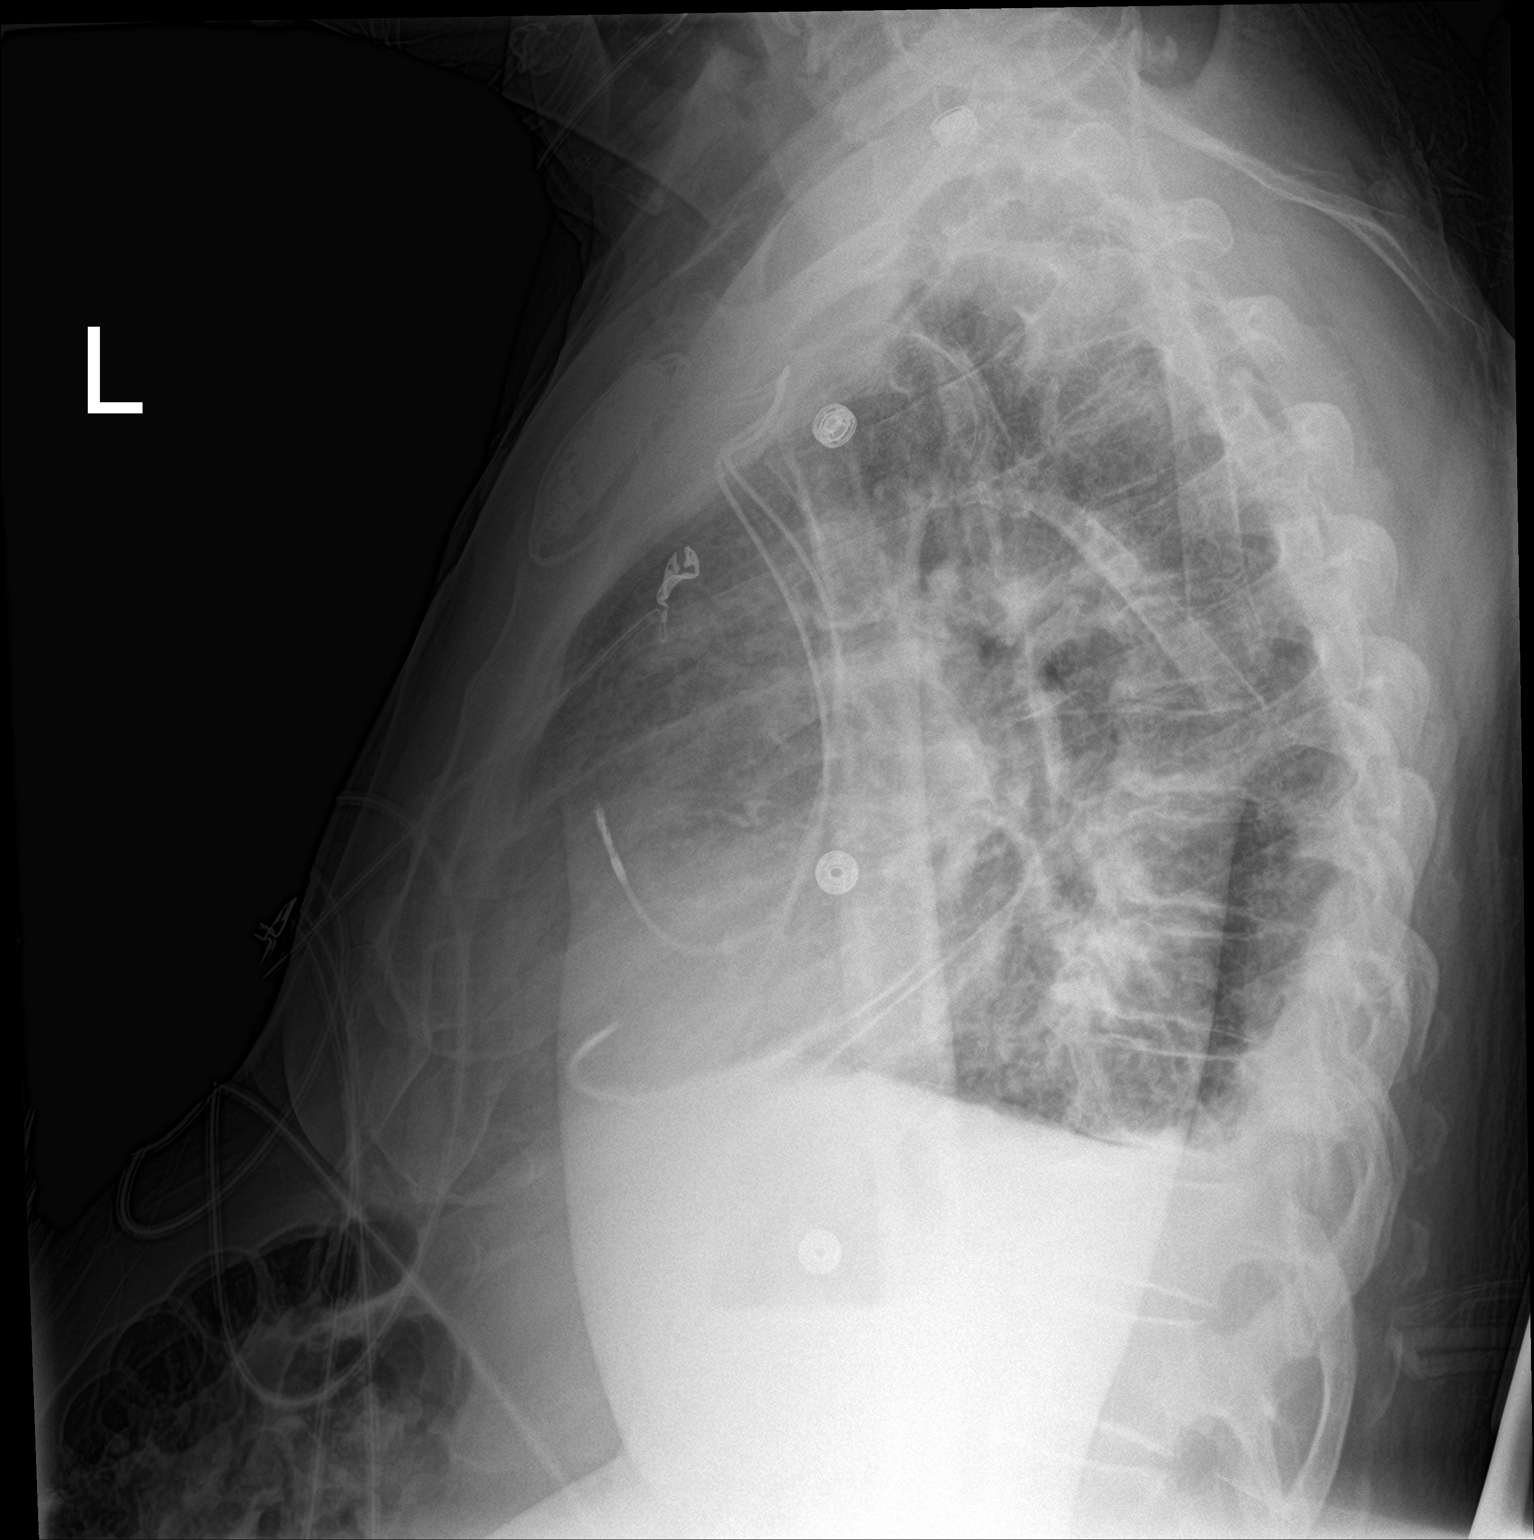

[chest ap]
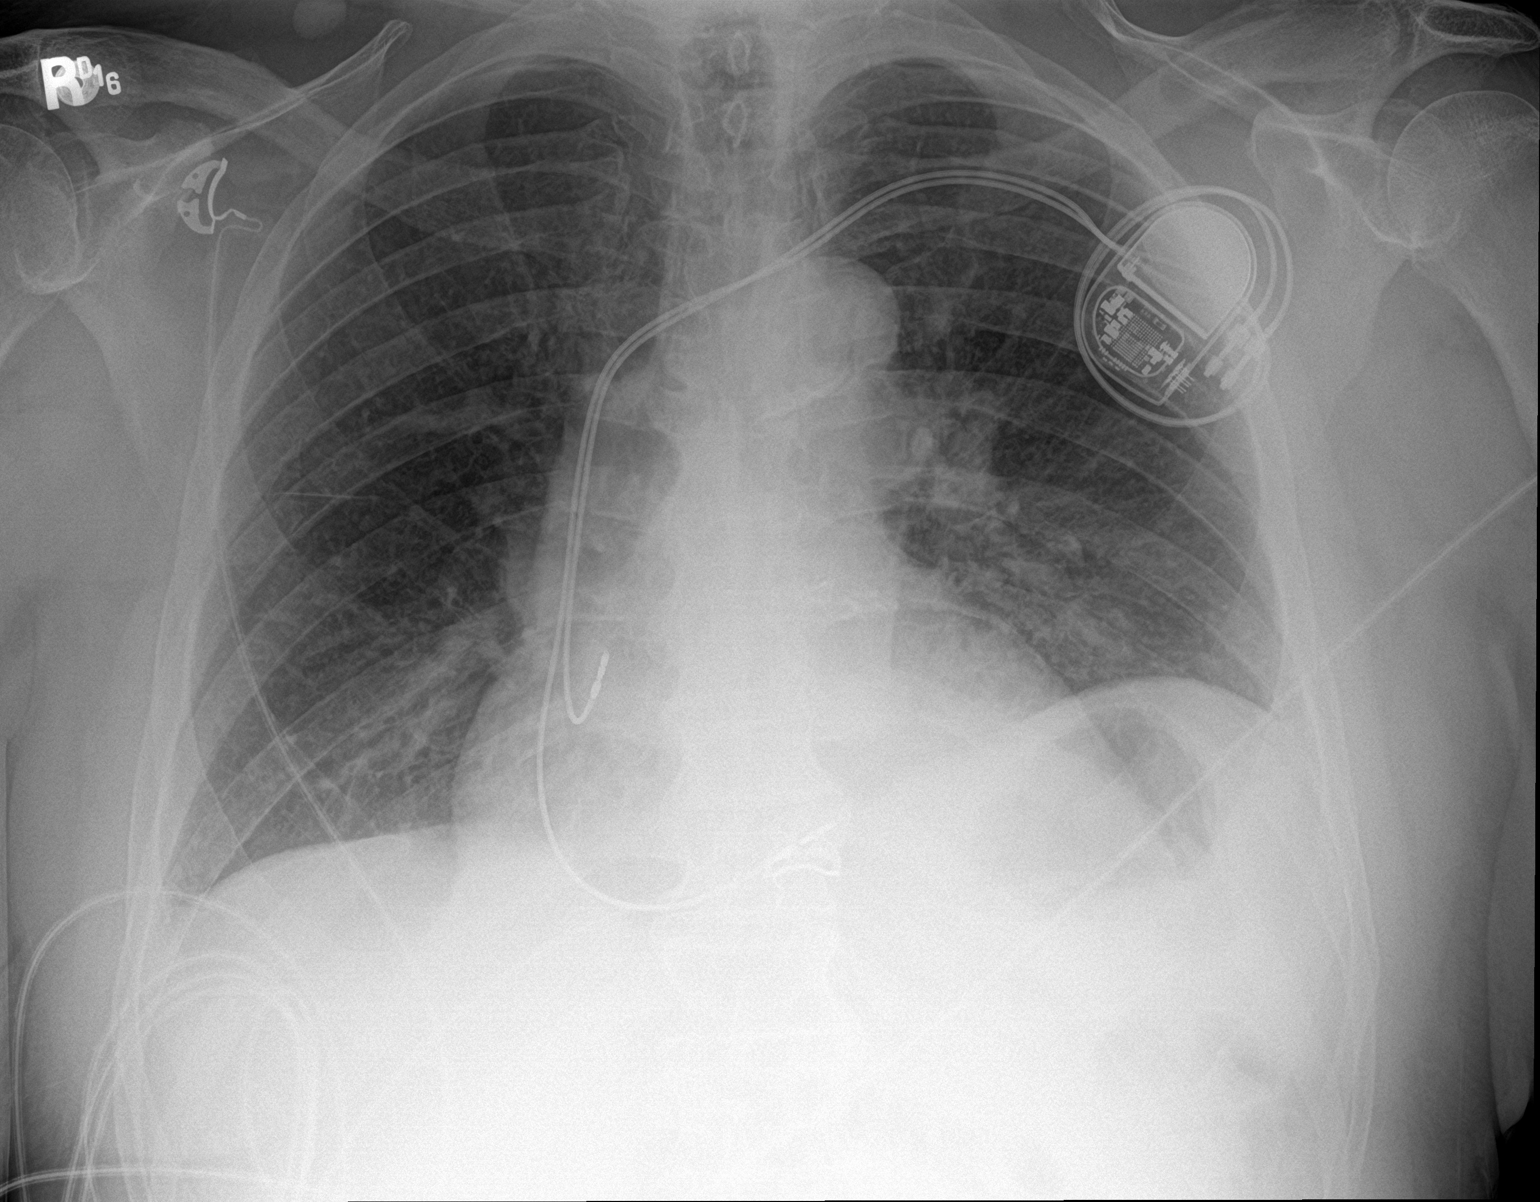

[2 of 2 positions shown; findings below may reference images not displayed]

FINDINGS: A new dual lead pacemaker is seen in appropriate position. No
evidence of pneumothorax. Low lung volumes are noted, with elevation
of left hemidiaphragm. Mild bibasilar atelectasis is seen. No
evidence of pulmonary consolidation or pleural effusion. Stable mild
cardiomegaly. Aortic atherosclerotic calcification incidentally
noted.
IMPRESSION: New pacemaker in appropriate position. No evidence of pneumothorax.

Low lung volumes with mild bibasilar atelectasis.

## 2023-03-31 DIAGNOSIS — M1990 Unspecified osteoarthritis, unspecified site: Secondary | ICD-10-CM | POA: Diagnosis not present

## 2023-03-31 DIAGNOSIS — N529 Male erectile dysfunction, unspecified: Secondary | ICD-10-CM | POA: Diagnosis not present

## 2023-03-31 DIAGNOSIS — I4891 Unspecified atrial fibrillation: Secondary | ICD-10-CM | POA: Diagnosis not present

## 2023-03-31 DIAGNOSIS — Z87891 Personal history of nicotine dependence: Secondary | ICD-10-CM | POA: Diagnosis not present

## 2023-03-31 DIAGNOSIS — Z8249 Family history of ischemic heart disease and other diseases of the circulatory system: Secondary | ICD-10-CM | POA: Diagnosis not present

## 2023-03-31 DIAGNOSIS — E1142 Type 2 diabetes mellitus with diabetic polyneuropathy: Secondary | ICD-10-CM | POA: Diagnosis not present

## 2023-03-31 DIAGNOSIS — Z008 Encounter for other general examination: Secondary | ICD-10-CM | POA: Diagnosis not present

## 2023-03-31 DIAGNOSIS — I130 Hypertensive heart and chronic kidney disease with heart failure and stage 1 through stage 4 chronic kidney disease, or unspecified chronic kidney disease: Secondary | ICD-10-CM | POA: Diagnosis not present

## 2023-03-31 DIAGNOSIS — I501 Left ventricular failure: Secondary | ICD-10-CM | POA: Diagnosis not present

## 2023-03-31 DIAGNOSIS — E1122 Type 2 diabetes mellitus with diabetic chronic kidney disease: Secondary | ICD-10-CM | POA: Diagnosis not present

## 2023-03-31 DIAGNOSIS — N189 Chronic kidney disease, unspecified: Secondary | ICD-10-CM | POA: Diagnosis not present

## 2023-03-31 DIAGNOSIS — D6869 Other thrombophilia: Secondary | ICD-10-CM | POA: Diagnosis not present

## 2023-03-31 DIAGNOSIS — E785 Hyperlipidemia, unspecified: Secondary | ICD-10-CM | POA: Diagnosis not present

## 2023-04-16 DIAGNOSIS — R600 Localized edema: Secondary | ICD-10-CM | POA: Diagnosis not present

## 2023-04-16 DIAGNOSIS — Z8546 Personal history of malignant neoplasm of prostate: Secondary | ICD-10-CM | POA: Diagnosis not present

## 2023-04-16 DIAGNOSIS — I5032 Chronic diastolic (congestive) heart failure: Secondary | ICD-10-CM | POA: Diagnosis not present

## 2023-04-16 DIAGNOSIS — I130 Hypertensive heart and chronic kidney disease with heart failure and stage 1 through stage 4 chronic kidney disease, or unspecified chronic kidney disease: Secondary | ICD-10-CM | POA: Diagnosis not present

## 2023-04-16 DIAGNOSIS — E785 Hyperlipidemia, unspecified: Secondary | ICD-10-CM | POA: Diagnosis not present

## 2023-04-16 DIAGNOSIS — F17211 Nicotine dependence, cigarettes, in remission: Secondary | ICD-10-CM | POA: Diagnosis not present

## 2023-04-16 DIAGNOSIS — E1129 Type 2 diabetes mellitus with other diabetic kidney complication: Secondary | ICD-10-CM | POA: Diagnosis not present

## 2023-04-16 DIAGNOSIS — Z950 Presence of cardiac pacemaker: Secondary | ICD-10-CM | POA: Diagnosis not present

## 2023-04-16 DIAGNOSIS — E291 Testicular hypofunction: Secondary | ICD-10-CM | POA: Diagnosis not present

## 2023-04-16 DIAGNOSIS — N1832 Chronic kidney disease, stage 3b: Secondary | ICD-10-CM | POA: Diagnosis not present

## 2023-04-16 DIAGNOSIS — Z23 Encounter for immunization: Secondary | ICD-10-CM | POA: Diagnosis not present

## 2023-04-18 ENCOUNTER — Other Ambulatory Visit: Payer: Self-pay | Admitting: Internal Medicine

## 2023-04-20 ENCOUNTER — Ambulatory Visit: Payer: Medicare HMO

## 2023-04-20 DIAGNOSIS — I442 Atrioventricular block, complete: Secondary | ICD-10-CM | POA: Diagnosis not present

## 2023-04-20 LAB — CUP PACEART REMOTE DEVICE CHECK
Date Time Interrogation Session: 20240927091727
Implantable Lead Connection Status: 753985
Implantable Lead Connection Status: 753985
Implantable Lead Implant Date: 20230617
Implantable Lead Implant Date: 20230617
Implantable Lead Location: 753859
Implantable Lead Location: 753860
Implantable Lead Model: 377
Implantable Lead Model: 377
Implantable Lead Serial Number: 8000835361
Implantable Lead Serial Number: 8000925809
Implantable Pulse Generator Implant Date: 20230617
Pulse Gen Model: 407145
Pulse Gen Serial Number: 70432387

## 2023-04-26 NOTE — Progress Notes (Signed)
Remote pacemaker transmission.   

## 2023-05-10 ENCOUNTER — Other Ambulatory Visit: Payer: Self-pay | Admitting: Internal Medicine

## 2023-05-10 MED ORDER — ENTRESTO 97-103 MG PO TABS: 1.0000 | ORAL_TABLET | Freq: Two times a day (BID) | ORAL | 0 refills | Status: DC

## 2023-05-21 ENCOUNTER — Other Ambulatory Visit: Payer: Self-pay | Admitting: Internal Medicine

## 2023-05-23 ENCOUNTER — Other Ambulatory Visit: Payer: Self-pay | Admitting: Internal Medicine

## 2023-05-23 MED ORDER — BUMETANIDE 1 MG PO TABS: 1.0000 mg | ORAL_TABLET | Freq: Two times a day (BID) | ORAL | 0 refills | Status: DC

## 2023-05-25 ENCOUNTER — Other Ambulatory Visit: Payer: Self-pay | Admitting: Internal Medicine

## 2023-05-26 ENCOUNTER — Other Ambulatory Visit: Payer: Self-pay | Admitting: Internal Medicine

## 2023-06-13 ENCOUNTER — Other Ambulatory Visit: Payer: Self-pay | Admitting: Internal Medicine

## 2023-06-18 NOTE — Progress Notes (Unsigned)
  Electrophysiology Office Note:   ID:  Arthur, Carr Dec 07, 1950, MRN [id number]  Primary Cardiologist: None Electrophysiologist: Lewayne Bunting, MD  {Click to update primary MD,subspecialty MD or APP then REFRESH:1}    History of Present Illness:   Arthur Carr is a 72 y.o. male with h/o HTN, DM2, CKDII, HLD, and CHB seen today for routine electrophysiology followup.   Since last being seen in our clinic the patient reports doing ***.  he denies chest pain, palpitations, dyspnea, PND, orthopnea, nausea, vomiting, dizziness, syncope, edema, weight gain, or early satiety.   Review of systems complete and found to be negative unless listed in HPI.   EP Information / Studies Reviewed:    EKG is ordered today. Personal review as below.       PPM Interrogation-  reviewed in detail today,  See PACEART report.  Device History: Biotronik dual chamber PPM implanted 01/06/22   Physical Exam:   VS:  There were no vitals taken for this visit.   Wt Readings from Last 3 Encounters:  04/13/22 240 lb (108.9 kg)  02/13/22 234 lb (106.1 kg)  01/19/22 233 lb (105.7 kg)     GEN: Well nourished, well developed in no acute distress NECK: No JVD; No carotid bruits CARDIAC: {EPRHYTHM:28826}, no murmurs, rubs, gallops RESPIRATORY:  Clear to auscultation without rales, wheezing or rhonchi  ABDOMEN: Soft, non-tender, non-distended EXTREMITIES:  No edema; No deformity   ASSESSMENT AND PLAN:    CHB s/p Biotronik PPM  Normal PPM function See Pace Art report No changes today  HTN Stable on current regimen   Diastolic CHF Volume status ***  {Click here to Review PMH, Prob List, Meds, Allergies, SHx, FHx  :1}   Disposition:   Follow up with {EPPROVIDERS:28135} {EPFOLLOW UP:28173}  Signed, Graciella Freer, PA-C

## 2023-06-19 ENCOUNTER — Ambulatory Visit: Payer: Medicare HMO | Attending: Student | Admitting: Student

## 2023-06-19 ENCOUNTER — Encounter: Payer: Self-pay | Admitting: Student

## 2023-06-19 VITALS — BP 136/70 | HR 97 | Ht 70.0 in | Wt 249.4 lb

## 2023-06-19 DIAGNOSIS — I5032 Chronic diastolic (congestive) heart failure: Secondary | ICD-10-CM

## 2023-06-19 DIAGNOSIS — I10 Essential (primary) hypertension: Secondary | ICD-10-CM

## 2023-06-19 DIAGNOSIS — I442 Atrioventricular block, complete: Secondary | ICD-10-CM

## 2023-06-19 LAB — CUP PACEART INCLINIC DEVICE CHECK
Date Time Interrogation Session: 20241126110337
Implantable Lead Connection Status: 753985
Implantable Lead Connection Status: 753985
Implantable Lead Implant Date: 20230617
Implantable Lead Implant Date: 20230617
Implantable Lead Location: 753859
Implantable Lead Location: 753860
Implantable Lead Model: 377
Implantable Lead Model: 377
Implantable Lead Serial Number: 8000835361
Implantable Lead Serial Number: 8000925809
Implantable Pulse Generator Implant Date: 20230617
Pulse Gen Model: 407145
Pulse Gen Serial Number: 70432387

## 2023-06-19 MED ORDER — ENTRESTO 97-103 MG PO TABS: 1.0000 | ORAL_TABLET | Freq: Two times a day (BID) | ORAL | 3 refills | Status: DC

## 2023-06-19 NOTE — Patient Instructions (Signed)
Medication Instructions:  Your physician recommends that you continue on your current medications as directed. Please refer to the Current Medication list given to you today.  *If you need a refill on your cardiac medications before your next appointment, please call your pharmacy*  Lab Work: BMET-TODAY If you have labs (blood work) drawn today and your tests are completely normal, you will receive your results only by: MyChart Message (if you have MyChart) OR A paper copy in the mail If you have any lab test that is abnormal or we need to change your treatment, we will call you to review the results.  Follow-Up: At [facility], you and your health needs are our priority.  As part of our continuing mission to provide you with exceptional heart care, we have created designated Provider Care Teams.  These Care Teams include your primary Cardiologist (physician) and Advanced Practice Providers (APPs -  Physician Assistants and Nurse Practitioners) who all work together to provide you with the care you need, when you need it.   Your next appointment:   1 year(s)  Provider:   Lewayne Bunting, MD or Doreatha Martin, PA-C

## 2023-06-20 LAB — BASIC METABOLIC PANEL
BUN/Creatinine Ratio: 8 — ABNORMAL LOW (ref 10–24)
BUN: 14 mg/dL (ref 8–27)
CO2: 27 mmol/L (ref 20–29)
Calcium: 9.2 mg/dL (ref 8.6–10.2)
Chloride: 99 mmol/L (ref 96–106)
Creatinine, Ser: 1.72 mg/dL — ABNORMAL HIGH (ref 0.76–1.27)
Glucose: 177 mg/dL — ABNORMAL HIGH (ref 70–99)
Potassium: 4.4 mmol/L (ref 3.5–5.2)
Sodium: 138 mmol/L (ref 134–144)
eGFR: 41 mL/min/{1.73_m2} — ABNORMAL LOW (ref >59)

## 2023-06-25 ENCOUNTER — Other Ambulatory Visit: Payer: Self-pay | Admitting: Internal Medicine

## 2023-06-28 ENCOUNTER — Other Ambulatory Visit (INDEPENDENT_AMBULATORY_CARE_PROVIDER_SITE_OTHER): Payer: Self-pay

## 2023-06-28 ENCOUNTER — Ambulatory Visit: Payer: Medicare HMO | Admitting: Physician Assistant

## 2023-06-28 ENCOUNTER — Other Ambulatory Visit (INDEPENDENT_AMBULATORY_CARE_PROVIDER_SITE_OTHER): Payer: Medicare HMO

## 2023-06-28 DIAGNOSIS — M25561 Pain in right knee: Secondary | ICD-10-CM | POA: Diagnosis not present

## 2023-06-28 DIAGNOSIS — G8929 Other chronic pain: Secondary | ICD-10-CM

## 2023-06-28 DIAGNOSIS — M25562 Pain in left knee: Secondary | ICD-10-CM | POA: Diagnosis not present

## 2023-06-28 DIAGNOSIS — M79672 Pain in left foot: Secondary | ICD-10-CM

## 2023-06-28 DIAGNOSIS — M79671 Pain in right foot: Secondary | ICD-10-CM

## 2023-06-28 DIAGNOSIS — Z96652 Presence of left artificial knee joint: Secondary | ICD-10-CM | POA: Diagnosis not present

## 2023-06-28 NOTE — Progress Notes (Signed)
Office Visit Note   Patient: Arthur Carr           Date of Birth: 12-20-1950           MRN: [id number] Visit Date: 06/28/2023              Requested by: Cleatis Polka., MD [address] [place],  [place] [address] PCP: Cleatis Polka., MD  Chief Complaint  Patient presents with   Left Knee - Pain   Right Knee - Pain      HPI: Patient is a pleasant 72 year old gentleman who presents today with a long history of chronic left knee pain.  He also has a little bit of pain in the right knee but that seems to gotten better.  He is status post right hip replacement with Dr. August Saucer many years ago at which time he was told that he would need left knee replacement surgery.  He declined this at this time.  No new injury.  He has tried a knee sleeve without much help.  Assessment & Plan: Visit Diagnoses:  1. Chronic pain of right knee   2. Chronic pain of left knee   3. Pain in right foot   4. Foot pain, bilateral     Plan: Patient has severe osteoarthritis of his left knee.  Also has peripheral vascular disease with significant swelling in his left greater than his right lower extremity.  I did talk to him about wearing compression hose which she says he is supposed to but he has difficulty getting them on.  Will refer him for new compression hose Vive which may not be quite as much compression but hopefully could provide him with some compression.  He does have palpable pulses he is got a lot of calcification in the venous system of his left lower extremity.  He would like to try a more formal brace we will give that to him today.  He also has inability to maintain routine footcare and he is a diabetic he has onychomycotic nails.  Will refer him for routine footcare.  If he were to go through with knee replacement surgery could refer him back to Dr. August Saucer but would probably need vascular studies of his left lower extremity prior to considering surgery  Follow-Up Instructions: No  follow-ups on file.   Ortho Exam  Patient is alert, oriented, no adenopathy, well-dressed, normal affect, normal respiratory effort. Examination of his bilateral knees right no swelling no effusion grinding with range of motion good stability.  Left knee has varus alignment clinically.  He has lower extremity swelling but no cellulitis compartments are soft and compressible he does have palpable pulses though difficult with the swelling in his foot but his foot is warm with good capillary refill he is gone here mycotic nails but no sign of infection  Imaging: XR Knee 1-2 Views Left  Result Date: 06/28/2023 3 views of his left knee demonstrate severe tricompartmental arthritis most notable in the medial compartment with bone-on-bone sclerotic changes periarticular osteophytes he also has venous calcifications  XR Knee 1-2 Views Right  Result Date: 06/28/2023 Three-view radiographs of the right knee demonstrate well-maintained alignment he does have tricompartmental arthritis more significant in the medial and patellofemoral compartments with sclerotic changes and periarticular osteophytes  No images are attached to the encounter.  Labs: Lab Results  Component Value Date   HGBA1C 7.2 (H) 11/08/2013   REPTSTATUS 11/09/2013 FINAL 11/08/2013   CULT NO GROWTH Performed at  [facility] 11/08/2013     Lab Results  Component Value Date   ALBUMIN 3.0 (L) 11/08/2013    Lab Results  Component Value Date   MG 1.9 01/08/2022   MG 1.9 01/06/2022   No results found for: "VD25OH"  No results found for: "PREALBUMIN"    Latest Ref Rng & Units 01/06/2022   12:16 PM 01/06/2022   12:10 PM 11/09/2013    6:35 AM  CBC EXTENDED  WBC 4.0 - 10.5 K/uL  5.2  8.2   RBC 4.22 - 5.81 MIL/uL  4.45  4.22   Hemoglobin 13.0 - 17.0 g/dL 29.5  62.1  30.8   HCT 39.0 - 52.0 % 40.0  40.9  37.4   Platelets 150 - 400 K/uL  178  146   NEUT# 1.7 - 7.7 K/uL  3.2    Lymph# 0.7 - 4.0 K/uL  1.4       There  is no height or weight on file to calculate BMI.  Orders:  Orders Placed This Encounter  Procedures   XR Knee 1-2 Views Right   XR Knee 1-2 Views Left   Ambulatory referral to Podiatry   No orders of the defined types were placed in this encounter.    Procedures: No procedures performed  Clinical Data: No additional findings.  ROS:  All other systems negative, except as noted in the HPI. Review of Systems  Objective: Vital Signs: There were no vitals taken for this visit.  Specialty Comments:  No specialty comments available.  PMFS History: Patient Active Problem List   Diagnosis Date Noted   Pacemaker 04/13/2022   Hypertension    Symptomatic advanced heart block 01/06/2022   Complete heart block (HCC) 01/06/2022   CAP (community acquired pneumonia) 11/09/2013    Class: Acute   Hypokalemia 11/09/2013    Class: Acute   Lactic acidosis 11/07/2013    Class: Acute   Fever 11/07/2013    Class: Acute   Bacteriuria 11/07/2013    Class: Acute   Prostate cancer (HCC) 08/06/2013   Past Medical History:  Diagnosis Date   Arthritis    Diabetes mellitus without complication (HCC)    Hypertension    Prostate cancer (HCC)     No family history on file.  Past Surgical History:  Procedure Laterality Date   JOINT REPLACEMENT  2007   RT TOTAL HIP   LYMPHADENECTOMY Bilateral 08/06/2013   Procedure: LYMPHADENECTOMY AND INDOCYANINE GREEN DYE  INJECTION;  Surgeon: Sebastian Ache, MD;  Location: [facility];  Service: Urology;  Laterality: Bilateral;   PACEMAKER IMPLANT N/A 01/07/2022   Procedure: PACEMAKER IMPLANT;  Surgeon: Marinus Maw, MD;  Location: [facility] INVASIVE CV LAB;  Service: Cardiovascular;  Laterality: N/A;   ROBOT ASSISTED LAPAROSCOPIC RADICAL PROSTATECTOMY N/A 08/06/2013   Procedure: ROBOTIC ASSISTED LAPAROSCOPIC RADICAL PROSTATECTOMY/OPEN UMBILICAL HERNIA REPAIR;  Surgeon: Sebastian Ache, MD;  Location: [facility];  Service: Urology;  Laterality: N/A;   Social History    Occupational History   Not on file  Tobacco Use   Smoking status: Former    Current packs/day: 0.00    Types: Cigarettes    Quit date: 12/2021    Years since quitting: 1.5   Smokeless tobacco: Former  Substance and Sexual Activity   Alcohol use: No   Drug use: No   Sexual activity: Not Currently

## 2023-07-09 ENCOUNTER — Ambulatory Visit: Payer: Medicare HMO | Admitting: Podiatry

## 2023-07-09 ENCOUNTER — Ambulatory Visit (INDEPENDENT_AMBULATORY_CARE_PROVIDER_SITE_OTHER): Payer: Medicare HMO

## 2023-07-09 ENCOUNTER — Encounter: Payer: Self-pay | Admitting: Podiatry

## 2023-07-09 DIAGNOSIS — E1140 Type 2 diabetes mellitus with diabetic neuropathy, unspecified: Secondary | ICD-10-CM

## 2023-07-09 DIAGNOSIS — B351 Tinea unguium: Secondary | ICD-10-CM | POA: Diagnosis not present

## 2023-07-09 DIAGNOSIS — M778 Other enthesopathies, not elsewhere classified: Secondary | ICD-10-CM | POA: Diagnosis not present

## 2023-07-09 NOTE — Progress Notes (Signed)
   Chief Complaint  Patient presents with   Nail Problem    Patient states he has a problem with all his toe nails on bilateral , this has been for about 20 years , he only takes tylenol for pain . Patient states it is just his toe nails and he will like them cut .     SUBJECTIVE Patient with a history of diabetes mellitus presents to office today complaining of elongated, thickened nails that cause pain while ambulating in shoes.  Patient is unable to trim their own nails. Patient is here for further evaluation and treatment.  Past Medical History:  Diagnosis Date   Arthritis    Diabetes mellitus without complication (HCC)    Hypertension    Prostate cancer (HCC)     No Known Allergies   OBJECTIVE General Patient is awake, alert, and oriented x 3 and in no acute distress. Derm Skin is dry and supple bilateral. Negative open lesions or macerations. Remaining integument unremarkable. Nails are tender, long, thickened and dystrophic with subungual debris, consistent with onychomycosis, 1-5 bilateral. No signs of infection noted. Vasc capillary refill immediate.  Temperature gradient within normal limits.  Chronic edema left lower extremity Neuro light touch and protective threshold sensation diminished bilaterally.  Musculoskeletal Exam No symptomatic pedal deformities noted bilateral. Muscular strength within normal limits.  No prior amputations  ASSESSMENT 1. Diabetes Mellitus w/ peripheral neuropathy 2.  Pain due to onychomycosis of toenails bilateral 3.  Chronic edema LLE  PLAN OF CARE -Patient evaluated today. -Instructed to maintain good pedal hygiene and foot care. Stressed importance of controlling blood sugar.  -Mechanical debridement of nails 1-5 bilaterally performed using a nail nipper. Filed with dremel without incident.  -Recommend compression stockings to the knee left lower extremity -Return to clinic in 3 mos.     Felecia Shelling, DPM [facility]  Dr. Felecia Shelling, DPM    [address] [place], [place] [address]                Office [phone]  Fax [phone]

## 2023-07-20 ENCOUNTER — Ambulatory Visit: Payer: Medicare HMO

## 2023-07-20 DIAGNOSIS — I442 Atrioventricular block, complete: Secondary | ICD-10-CM | POA: Diagnosis not present

## 2023-07-23 ENCOUNTER — Other Ambulatory Visit: Payer: Self-pay | Admitting: Internal Medicine

## 2023-07-23 LAB — CUP PACEART REMOTE DEVICE CHECK
Date Time Interrogation Session: 20241227065256
Implantable Lead Connection Status: 753985
Implantable Lead Connection Status: 753985
Implantable Lead Implant Date: 20230617
Implantable Lead Implant Date: 20230617
Implantable Lead Location: 753859
Implantable Lead Location: 753860
Implantable Lead Model: 377
Implantable Lead Model: 377
Implantable Lead Serial Number: 8000835361
Implantable Lead Serial Number: 8000925809
Implantable Pulse Generator Implant Date: 20230617
Pulse Gen Model: 407145
Pulse Gen Serial Number: 70432387

## 2023-08-23 NOTE — Addendum Note (Signed)
Addended by: Elease Etienne A on: 08/23/2023 12:12 PM   Modules accepted: Orders

## 2023-08-23 NOTE — Progress Notes (Signed)
Remote pacemaker transmission.

## 2023-09-22 ENCOUNTER — Encounter (HOSPITAL_COMMUNITY): Payer: Self-pay

## 2023-09-22 ENCOUNTER — Emergency Department (HOSPITAL_COMMUNITY)
Admission: EM | Admit: 2023-09-22 | Discharge: 2023-09-22 | Disposition: A | Discharge status: Home / Self Care | Attending: Emergency Medicine | Admitting: Emergency Medicine

## 2023-09-22 ENCOUNTER — Other Ambulatory Visit: Payer: Self-pay

## 2023-09-22 DIAGNOSIS — Z041 Encounter for examination and observation following transport accident: Secondary | ICD-10-CM | POA: Insufficient documentation

## 2023-09-22 DIAGNOSIS — Z79899 Other long term (current) drug therapy: Secondary | ICD-10-CM | POA: Diagnosis not present

## 2023-09-22 DIAGNOSIS — Z7982 Long term (current) use of aspirin: Secondary | ICD-10-CM | POA: Insufficient documentation

## 2023-09-22 DIAGNOSIS — E119 Type 2 diabetes mellitus without complications: Secondary | ICD-10-CM | POA: Diagnosis not present

## 2023-09-22 DIAGNOSIS — Z7984 Long term (current) use of oral hypoglycemic drugs: Secondary | ICD-10-CM | POA: Insufficient documentation

## 2023-09-22 DIAGNOSIS — I10 Essential (primary) hypertension: Secondary | ICD-10-CM | POA: Insufficient documentation
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
## 2023-10-08 ENCOUNTER — Ambulatory Visit: Payer: Medicare HMO | Admitting: Podiatry

## 2023-10-15 ENCOUNTER — Ambulatory Visit: Payer: Medicare HMO | Admitting: Podiatry

## 2023-10-18 DIAGNOSIS — E291 Testicular hypofunction: Secondary | ICD-10-CM | POA: Diagnosis not present

## 2023-10-18 DIAGNOSIS — N1832 Chronic kidney disease, stage 3b: Secondary | ICD-10-CM | POA: Diagnosis not present

## 2023-10-18 DIAGNOSIS — E1129 Type 2 diabetes mellitus with other diabetic kidney complication: Secondary | ICD-10-CM | POA: Diagnosis not present

## 2023-10-18 DIAGNOSIS — I5032 Chronic diastolic (congestive) heart failure: Secondary | ICD-10-CM | POA: Diagnosis not present

## 2023-10-18 DIAGNOSIS — E785 Hyperlipidemia, unspecified: Secondary | ICD-10-CM | POA: Diagnosis not present

## 2023-10-18 DIAGNOSIS — I130 Hypertensive heart and chronic kidney disease with heart failure and stage 1 through stage 4 chronic kidney disease, or unspecified chronic kidney disease: Secondary | ICD-10-CM | POA: Diagnosis not present

## 2023-10-18 DIAGNOSIS — E1149 Type 2 diabetes mellitus with other diabetic neurological complication: Secondary | ICD-10-CM | POA: Diagnosis not present

## 2023-10-18 DIAGNOSIS — Z8546 Personal history of malignant neoplasm of prostate: Secondary | ICD-10-CM | POA: Diagnosis not present

## 2023-10-19 ENCOUNTER — Ambulatory Visit (INDEPENDENT_AMBULATORY_CARE_PROVIDER_SITE_OTHER): Payer: Medicare HMO

## 2023-10-19 DIAGNOSIS — I442 Atrioventricular block, complete: Secondary | ICD-10-CM

## 2023-10-22 ENCOUNTER — Ambulatory Visit: Admitting: Podiatry

## 2023-10-22 LAB — CUP PACEART REMOTE DEVICE CHECK
Battery Voltage: 85
Date Time Interrogation Session: 20250328084611
Implantable Lead Connection Status: 753985
Implantable Lead Connection Status: 753985
Implantable Lead Implant Date: 20230617
Implantable Lead Implant Date: 20230617
Implantable Lead Location: 753859
Implantable Lead Location: 753860
Implantable Lead Model: 377
Implantable Lead Model: 377
Implantable Lead Serial Number: 8000835361
Implantable Lead Serial Number: 8000925809
Implantable Pulse Generator Implant Date: 20230617
Pulse Gen Model: 407145
Pulse Gen Serial Number: 70432387

## 2023-10-25 DIAGNOSIS — Z0000 Encounter for general adult medical examination without abnormal findings: Secondary | ICD-10-CM | POA: Diagnosis not present

## 2023-10-25 DIAGNOSIS — Z8546 Personal history of malignant neoplasm of prostate: Secondary | ICD-10-CM | POA: Diagnosis not present

## 2023-10-25 DIAGNOSIS — R600 Localized edema: Secondary | ICD-10-CM | POA: Diagnosis not present

## 2023-10-25 DIAGNOSIS — I5032 Chronic diastolic (congestive) heart failure: Secondary | ICD-10-CM | POA: Diagnosis not present

## 2023-10-25 DIAGNOSIS — Z1339 Encounter for screening examination for other mental health and behavioral disorders: Secondary | ICD-10-CM | POA: Diagnosis not present

## 2023-10-25 DIAGNOSIS — R82998 Other abnormal findings in urine: Secondary | ICD-10-CM | POA: Diagnosis not present

## 2023-10-25 DIAGNOSIS — E785 Hyperlipidemia, unspecified: Secondary | ICD-10-CM | POA: Diagnosis not present

## 2023-10-25 DIAGNOSIS — E1129 Type 2 diabetes mellitus with other diabetic kidney complication: Secondary | ICD-10-CM | POA: Diagnosis not present

## 2023-10-25 DIAGNOSIS — Z950 Presence of cardiac pacemaker: Secondary | ICD-10-CM | POA: Diagnosis not present

## 2023-10-25 DIAGNOSIS — I130 Hypertensive heart and chronic kidney disease with heart failure and stage 1 through stage 4 chronic kidney disease, or unspecified chronic kidney disease: Secondary | ICD-10-CM | POA: Diagnosis not present

## 2023-10-25 DIAGNOSIS — G629 Polyneuropathy, unspecified: Secondary | ICD-10-CM | POA: Diagnosis not present

## 2023-10-25 DIAGNOSIS — N1832 Chronic kidney disease, stage 3b: Secondary | ICD-10-CM | POA: Diagnosis not present

## 2023-10-25 DIAGNOSIS — I10 Essential (primary) hypertension: Secondary | ICD-10-CM | POA: Diagnosis not present

## 2023-10-25 DIAGNOSIS — E1149 Type 2 diabetes mellitus with other diabetic neurological complication: Secondary | ICD-10-CM | POA: Diagnosis not present

## 2023-10-25 DIAGNOSIS — Z6835 Body mass index (BMI) 35.0-35.9, adult: Secondary | ICD-10-CM | POA: Diagnosis not present

## 2023-10-25 DIAGNOSIS — F17211 Nicotine dependence, cigarettes, in remission: Secondary | ICD-10-CM | POA: Diagnosis not present

## 2023-10-25 DIAGNOSIS — Z1331 Encounter for screening for depression: Secondary | ICD-10-CM | POA: Diagnosis not present

## 2023-10-28 ENCOUNTER — Encounter: Payer: Self-pay | Admitting: Internal Medicine

## 2023-11-12 DIAGNOSIS — E119 Type 2 diabetes mellitus without complications: Secondary | ICD-10-CM | POA: Diagnosis not present

## 2023-11-12 DIAGNOSIS — H25813 Combined forms of age-related cataract, bilateral: Secondary | ICD-10-CM | POA: Diagnosis not present

## 2023-11-14 ENCOUNTER — Encounter: Payer: Self-pay | Admitting: Podiatry

## 2023-11-14 ENCOUNTER — Ambulatory Visit: Admitting: Podiatry

## 2023-11-14 VITALS — Ht 70.5 in | Wt 240.0 lb

## 2023-11-14 DIAGNOSIS — M79675 Pain in left toe(s): Secondary | ICD-10-CM | POA: Diagnosis not present

## 2023-11-14 DIAGNOSIS — B351 Tinea unguium: Secondary | ICD-10-CM

## 2023-11-14 DIAGNOSIS — M79674 Pain in right toe(s): Secondary | ICD-10-CM

## 2023-11-14 NOTE — Progress Notes (Signed)
   Chief Complaint  Patient presents with   Nail Problem    Patient is here for [facility]     SUBJECTIVE Patient with a history of diabetes mellitus presents to office today complaining of elongated, thickened nails that cause pain while ambulating in shoes.  Patient is unable to trim their own nails. Patient is here for further evaluation and treatment.  Past Medical History:  Diagnosis Date   Arthritis    Diabetes mellitus without complication (HCC)    Hypertension    Prostate cancer (HCC)     No Known Allergies   OBJECTIVE General Patient is awake, alert, and oriented x 3 and in no acute distress. Derm Skin is dry and supple bilateral. Negative open lesions or macerations. Remaining integument unremarkable. Nails are tender, long, thickened and dystrophic with subungual debris, consistent with onychomycosis, 1-5 bilateral. No signs of infection noted. Vasc  DP and PT pedal pulses palpable bilaterally. Temperature gradient within normal limits.  Neuro Epicritic and protective threshold sensation diminished bilaterally.  Musculoskeletal Exam No symptomatic pedal deformities noted bilateral. Muscular strength within normal limits.  ASSESSMENT 1. Diabetes Mellitus w/ peripheral neuropathy 2.  Pain due to onychomycosis of toenails bilateral  PLAN OF CARE 1. Patient evaluated today. 2. Instructed to maintain good pedal hygiene and foot care. Stressed importance of controlling blood sugar.  3. Mechanical debridement of nails 1-5 bilaterally performed using a nail nipper. Filed with dremel without incident.  4. Return to clinic in 3 mos.     Dot Gazella, DPM [facility]  Dr. Dot Gazella, DPM    [address] [place], [place] [address]                Office [phone]  Fax [phone]

## 2023-11-27 NOTE — Addendum Note (Signed)
 Addended by: Lott Rouleau A on: 11/27/2023 05:09 PM   Modules accepted: Orders

## 2023-11-27 NOTE — Progress Notes (Signed)
 Remote pacemaker transmission.

## 2024-01-18 ENCOUNTER — Ambulatory Visit (INDEPENDENT_AMBULATORY_CARE_PROVIDER_SITE_OTHER): Payer: Medicare HMO

## 2024-01-18 DIAGNOSIS — I442 Atrioventricular block, complete: Secondary | ICD-10-CM

## 2024-01-18 LAB — CUP PACEART REMOTE DEVICE CHECK
Battery Voltage: 85
Date Time Interrogation Session: 20250627090710
Implantable Lead Connection Status: 753985
Implantable Lead Connection Status: 753985
Implantable Lead Implant Date: 20230617
Implantable Lead Implant Date: 20230617
Implantable Lead Location: 753859
Implantable Lead Location: 753860
Implantable Lead Model: 377
Implantable Lead Model: 377
Implantable Lead Serial Number: 8000835361
Implantable Lead Serial Number: 8000925809
Implantable Pulse Generator Implant Date: 20230617
Pulse Gen Model: 407145
Pulse Gen Serial Number: 70432387

## 2024-01-21 ENCOUNTER — Ambulatory Visit: Payer: Self-pay | Admitting: Internal Medicine

## 2024-02-20 ENCOUNTER — Ambulatory Visit: Admitting: Podiatry

## 2024-02-20 ENCOUNTER — Encounter: Payer: Self-pay | Admitting: Podiatry

## 2024-02-20 DIAGNOSIS — B353 Tinea pedis: Secondary | ICD-10-CM

## 2024-02-20 DIAGNOSIS — E0822 Diabetes mellitus due to underlying condition with diabetic chronic kidney disease: Secondary | ICD-10-CM

## 2024-02-20 DIAGNOSIS — M79675 Pain in left toe(s): Secondary | ICD-10-CM

## 2024-02-20 DIAGNOSIS — B351 Tinea unguium: Secondary | ICD-10-CM | POA: Diagnosis not present

## 2024-02-20 DIAGNOSIS — N1832 Chronic kidney disease, stage 3b: Secondary | ICD-10-CM

## 2024-02-20 DIAGNOSIS — M79674 Pain in right toe(s): Secondary | ICD-10-CM

## 2024-02-20 MED ORDER — KETOCONAZOLE 2 % EX CREA: TOPICAL_CREAM | CUTANEOUS | 1 refills | Status: DC

## 2024-02-21 ENCOUNTER — Other Ambulatory Visit: Payer: Self-pay

## 2024-02-21 MED ORDER — CARVEDILOL 25 MG PO TABS: ORAL_TABLET | ORAL | 0 refills | Status: DC

## 2024-02-22 ENCOUNTER — Encounter: Payer: Self-pay | Admitting: Podiatry

## 2024-02-22 NOTE — Progress Notes (Signed)
  Subjective:  Patient ID: Arthur Carr, male    DOB: 08-06-1950,  MRN: [id number]  73 y.o. male presents preventative diabetic foot care and painful thick toenails that are difficult to trim. Pain interferes with ambulation. Aggravating factors include wearing enclosed shoe gear. Pain is relieved with periodic professional debridement.  Chief Complaint  Patient presents with   Diabetes    DFC NIDDM A1C 7.2. Toenail trim. LOV with PCP 10/2023.    New problem(s): None   PCP is Loreli Elsie JONETTA Mickey., MD.  No Known Allergies  Review of Systems: Negative except as noted in the HPI.   Objective:  Arthur Carr is a pleasant 73 y.o. male obese in NAD. AAO x 3.  Vascular Examination: Vascular status intact b/l with palpable pedal pulses. CFT immediate b/l. Pedal hair present. No edema. No pain with calf compression b/l. Skin temperature gradient WNL b/l. No varicosities noted. No cyanosis or clubbing noted.  Neurological Examination: Sensation grossly intact b/l with 10 gram monofilament. Vibratory sensation intact b/l.  Dermatological Examination: Pedal skin with normal turgor, texture and tone b/l. No open wounds nor interdigital macerations noted. Toenails 1-5 b/l thick, discolored, elongated with subungual debris and pain on dorsal palpation. No hyperkeratotic lesions noted b/l. Diffuse scaling noted peripherally and plantarly b/l feet.  No interdigital macerations.  No blisters, no weeping. No signs of secondary bacterial infection noted.  Musculoskeletal Examination: Muscle strength 5/5 to b/l LE.  No pain, crepitus noted b/l. No gross pedal deformities. Patient ambulates independently without assistive aids.   Radiographs: None  Assessment:   1. Pain due to onychomycosis of toenails of both feet   2. Tinea pedis of both feet   3. Diabetes mellitus due to underlying condition with stage 3b chronic kidney disease, without long-term current use of insulin  (HCC)    Plan:   Patient was evaluated and treated. All patient's and/or POA's questions/concerns addressed on today's visit. Toenails 1-5 debrided in length and girth without incident. Continue foot and shoe inspections daily. Monitor blood glucose per PCP/Endocrinologist's recommendations. Continue soft, supportive shoe gear daily. Report any pedal injuries to medical professional. -Discussed tinea pedis infection. To prevent re-infection of tinea pedis, patient/POA/caregiver instructed to spray shoes with Lysol every evening and clean tub/shower with bleach based cleanser. Rx sent to pharmacy for Ketoconazole  Cream 2% to be applied to both feet and between toes once daily for six week.  -Patient/POA to call should there be question/concern in the interim.  Return in about 3 months (around 05/22/2024).  Delon LITTIE Merlin, DPM      [place] LOCATION: [address], [place] [address]                   Office [phone]   [facility] LOCATION: [address] [place], [place] [address] Office [phone]

## 2024-03-26 ENCOUNTER — Other Ambulatory Visit: Payer: Self-pay | Admitting: Internal Medicine

## 2024-03-31 NOTE — Progress Notes (Signed)
 Remote pacemaker transmission.

## 2024-04-18 ENCOUNTER — Ambulatory Visit (INDEPENDENT_AMBULATORY_CARE_PROVIDER_SITE_OTHER): Payer: Medicare HMO

## 2024-04-18 DIAGNOSIS — I442 Atrioventricular block, complete: Secondary | ICD-10-CM | POA: Diagnosis not present

## 2024-04-19 LAB — CUP PACEART REMOTE DEVICE CHECK
Date Time Interrogation Session: 20250926102530
Implantable Lead Connection Status: 753985
Implantable Lead Connection Status: 753985
Implantable Lead Implant Date: 20230617
Implantable Lead Implant Date: 20230617
Implantable Lead Location: 753859
Implantable Lead Location: 753860
Implantable Lead Model: 377
Implantable Lead Model: 377
Implantable Lead Serial Number: 8000835361
Implantable Lead Serial Number: 8000925809
Implantable Pulse Generator Implant Date: 20230617
Pulse Gen Model: 407145
Pulse Gen Serial Number: 70432387

## 2024-04-20 ENCOUNTER — Ambulatory Visit: Payer: Self-pay | Admitting: Internal Medicine

## 2024-04-23 ENCOUNTER — Other Ambulatory Visit: Payer: Self-pay | Admitting: Podiatry

## 2024-04-23 ENCOUNTER — Other Ambulatory Visit: Payer: Self-pay | Admitting: Student

## 2024-04-23 DIAGNOSIS — B353 Tinea pedis: Secondary | ICD-10-CM

## 2024-04-23 DIAGNOSIS — I5032 Chronic diastolic (congestive) heart failure: Secondary | ICD-10-CM

## 2024-04-23 DIAGNOSIS — I10 Essential (primary) hypertension: Secondary | ICD-10-CM

## 2024-04-23 NOTE — Progress Notes (Signed)
 Remote PPM Transmission

## 2024-04-24 MED ORDER — HYDRALAZINE HCL 50 MG PO TABS: 50.0000 mg | ORAL_TABLET | Freq: Three times a day (TID) | ORAL | 0 refills | Status: AC

## 2024-05-26 ENCOUNTER — Encounter: Payer: Self-pay | Admitting: Radiology

## 2024-06-10 ENCOUNTER — Ambulatory Visit: Admitting: Podiatry

## 2024-06-16 DIAGNOSIS — F17211 Nicotine dependence, cigarettes, in remission: Secondary | ICD-10-CM | POA: Diagnosis not present

## 2024-06-16 DIAGNOSIS — E1122 Type 2 diabetes mellitus with diabetic chronic kidney disease: Secondary | ICD-10-CM | POA: Diagnosis not present

## 2024-06-16 DIAGNOSIS — E1149 Type 2 diabetes mellitus with other diabetic neurological complication: Secondary | ICD-10-CM | POA: Diagnosis not present

## 2024-06-16 DIAGNOSIS — I130 Hypertensive heart and chronic kidney disease with heart failure and stage 1 through stage 4 chronic kidney disease, or unspecified chronic kidney disease: Secondary | ICD-10-CM | POA: Diagnosis not present

## 2024-06-16 DIAGNOSIS — I442 Atrioventricular block, complete: Secondary | ICD-10-CM | POA: Diagnosis not present

## 2024-06-16 DIAGNOSIS — I5032 Chronic diastolic (congestive) heart failure: Secondary | ICD-10-CM | POA: Diagnosis not present

## 2024-06-16 DIAGNOSIS — Z950 Presence of cardiac pacemaker: Secondary | ICD-10-CM | POA: Diagnosis not present

## 2024-06-16 DIAGNOSIS — R600 Localized edema: Secondary | ICD-10-CM | POA: Diagnosis not present

## 2024-06-16 DIAGNOSIS — Z8546 Personal history of malignant neoplasm of prostate: Secondary | ICD-10-CM | POA: Diagnosis not present

## 2024-06-16 DIAGNOSIS — E785 Hyperlipidemia, unspecified: Secondary | ICD-10-CM | POA: Diagnosis not present

## 2024-06-16 DIAGNOSIS — N1832 Chronic kidney disease, stage 3b: Secondary | ICD-10-CM | POA: Diagnosis not present

## 2024-07-18 ENCOUNTER — Ambulatory Visit: Payer: Medicare HMO

## 2024-07-18 DIAGNOSIS — I5032 Chronic diastolic (congestive) heart failure: Secondary | ICD-10-CM

## 2024-07-20 LAB — CUP PACEART REMOTE DEVICE CHECK
Date Time Interrogation Session: 20251226172906
Implantable Lead Connection Status: 753985
Implantable Lead Connection Status: 753985
Implantable Lead Implant Date: 20230617
Implantable Lead Implant Date: 20230617
Implantable Lead Location: 753859
Implantable Lead Location: 753860
Implantable Lead Model: 377
Implantable Lead Model: 377
Implantable Lead Serial Number: 8000835361
Implantable Lead Serial Number: 8000925809
Implantable Pulse Generator Implant Date: 20230617
Pulse Gen Model: 407145
Pulse Gen Serial Number: 70432387

## 2024-07-22 ENCOUNTER — Ambulatory Visit: Payer: Self-pay | Admitting: Internal Medicine

## 2024-07-23 NOTE — Progress Notes (Signed)
 Remote PPM Transmission

## 2024-08-06 ENCOUNTER — Encounter: Payer: Self-pay | Admitting: Podiatry

## 2024-08-06 ENCOUNTER — Ambulatory Visit: Admitting: Podiatry

## 2024-08-06 DIAGNOSIS — E0822 Diabetes mellitus due to underlying condition with diabetic chronic kidney disease: Secondary | ICD-10-CM | POA: Diagnosis not present

## 2024-08-06 DIAGNOSIS — B351 Tinea unguium: Secondary | ICD-10-CM

## 2024-08-06 DIAGNOSIS — N1832 Chronic kidney disease, stage 3b: Secondary | ICD-10-CM

## 2024-08-06 DIAGNOSIS — M79674 Pain in right toe(s): Secondary | ICD-10-CM

## 2024-08-06 DIAGNOSIS — M79675 Pain in left toe(s): Secondary | ICD-10-CM | POA: Diagnosis not present

## 2024-08-16 NOTE — Progress Notes (Signed)
"  °  Subjective:  Patient ID: Arthur Carr, male    DOB: 1950/11/06,  MRN: [id number]  Arthur Carr presents to clinic today for at risk foot care. Pt has h/o NIDDM with chronic kidney disease  Chief Complaint  Patient presents with   RFC    RFC. Not Diabetic. Arthur Elsie JONETTA Mickey., MD(PCP), 10/25/23.    New problem(s): None.   PCP is Arthur Elsie JONETTA Mickey., MD.  Allergies[1]  Review of Systems: Negative except as noted in the HPI.  Objective:  There were no vitals filed for this visit. Arthur Carr is a pleasant 74 y.o. male obese in NAD. AAO x 3.  Vascular Examination: Vascular status intact b/l with palpable pedal pulses. CFT immediate b/l. Pedal hair present. No edema. No pain with calf compression b/l. Skin temperature gradient WNL b/l. No varicosities noted. No cyanosis or clubbing noted.  Neurological Examination: Sensation grossly intact b/l with 10 gram monofilament. Vibratory sensation intact b/l.  Dermatological Examination: Pedal skin with normal turgor, texture and tone b/l. No open wounds nor interdigital macerations noted. Toenails 1-5 b/l thick, discolored, elongated with subungual debris and pain on dorsal palpation. No hyperkeratotic lesions noted b/l.   Musculoskeletal Examination: Muscle strength 5/5 to b/l LE.  No pain, crepitus noted b/l. No gross pedal deformities. Patient ambulates independently without assistive aids.   Radiographs: None  Assessment/Plan: 1. Pain due to onychomycosis of toenails of both feet   2. Diabetes mellitus due to underlying condition with stage 3b chronic kidney disease, without long-term current use of insulin  [facility])   Consent given for treatment. Patient examined. All patient's and/or POA's questions/concerns addressed on today's visit. Mycotic toenails 1-5 b/l debrided in length and girth without incident. Continue foot and shoe inspections daily. Monitor blood glucose per PCP/Endocrinologist's recommendations.Continue soft,  supportive shoe gear daily. Report any pedal injuries to medical professional. Call office if there are any quesitons/concerns. -Patient/POA to call should there be question/concern in the interim.   Return in about 3 months (around 11/04/2024).  Delon LITTIE Merlin, DPM      [place] LOCATION: [address] [place] [address]                   Office [phone]   [facility] LOCATION: [address] [place], [place] [address] Office [phone]     [1] No Known Allergies  "

## 2024-11-12 ENCOUNTER — Ambulatory Visit: Admitting: Podiatry
